# Patient Record
Sex: Male | Born: 1980 | Race: Black or African American | Hispanic: No | Smoking: Current some day smoker
Health system: Southern US, Community
[De-identification: ages and names within clinical notes are randomized; demographics above are authoritative.]

---

## 2002-12-27 ENCOUNTER — Emergency Department (HOSPITAL_COMMUNITY): Admission: EM | Admit: 2002-12-27 | Discharge: 2002-12-27 | Payer: Self-pay | Admitting: Emergency Medicine

## 2003-06-16 ENCOUNTER — Emergency Department (HOSPITAL_COMMUNITY): Admission: EM | Admit: 2003-06-16 | Discharge: 2003-06-16 | Payer: Self-pay | Admitting: *Deleted

## 2003-06-30 ENCOUNTER — Emergency Department (HOSPITAL_COMMUNITY): Admission: EM | Admit: 2003-06-30 | Discharge: 2003-06-30 | Payer: Self-pay | Admitting: Emergency Medicine

## 2005-10-27 ENCOUNTER — Emergency Department: Payer: Self-pay | Admitting: Emergency Medicine

## 2006-09-11 ENCOUNTER — Emergency Department (HOSPITAL_COMMUNITY): Admission: EM | Admit: 2006-09-11 | Discharge: 2006-09-11 | Payer: Self-pay | Admitting: Emergency Medicine

## 2013-05-18 ENCOUNTER — Encounter (HOSPITAL_COMMUNITY): Payer: Self-pay | Admitting: *Deleted

## 2013-05-18 ENCOUNTER — Emergency Department (HOSPITAL_COMMUNITY)
Admission: EM | Admit: 2013-05-18 | Discharge: 2013-05-18 | Disposition: A | Discharge status: Home / Self Care | Payer: Medicaid Other | Attending: Emergency Medicine | Admitting: Emergency Medicine

## 2013-05-18 DIAGNOSIS — H53149 Visual discomfort, unspecified: Secondary | ICD-10-CM | POA: Insufficient documentation

## 2013-05-18 DIAGNOSIS — Y929 Unspecified place or not applicable: Secondary | ICD-10-CM | POA: Insufficient documentation

## 2013-05-18 DIAGNOSIS — S0501XA Injury of conjunctiva and corneal abrasion without foreign body, right eye, initial encounter: Secondary | ICD-10-CM

## 2013-05-18 DIAGNOSIS — Y939 Activity, unspecified: Secondary | ICD-10-CM | POA: Insufficient documentation

## 2013-05-18 DIAGNOSIS — H5789 Other specified disorders of eye and adnexa: Secondary | ICD-10-CM | POA: Insufficient documentation

## 2013-05-18 DIAGNOSIS — Z23 Encounter for immunization: Secondary | ICD-10-CM | POA: Insufficient documentation

## 2013-05-18 DIAGNOSIS — S058X9A Other injuries of unspecified eye and orbit, initial encounter: Secondary | ICD-10-CM | POA: Insufficient documentation

## 2013-05-18 DIAGNOSIS — T1590XA Foreign body on external eye, part unspecified, unspecified eye, initial encounter: Secondary | ICD-10-CM | POA: Insufficient documentation

## 2013-05-18 DIAGNOSIS — F17200 Nicotine dependence, unspecified, uncomplicated: Secondary | ICD-10-CM | POA: Insufficient documentation

## 2013-05-18 MED ORDER — TETRACAINE HCL 0.5 % OP SOLN
1.0000 [drp] | Freq: Once | OPHTHALMIC | Status: AC
  Administered 2013-05-18: 1 [drp] via OPHTHALMIC
  Filled 2013-05-18: qty 2

## 2013-05-18 MED ORDER — TOBRAMYCIN 0.3 % OP SOLN
2.0000 [drp] | OPHTHALMIC | Status: DC
  Administered 2013-05-18: 2 [drp] via OPHTHALMIC
  Filled 2013-05-18: qty 5

## 2013-05-18 MED ORDER — FLUORESCEIN SODIUM 1 MG OP STRP
ORAL_STRIP | OPHTHALMIC | Status: AC
  Filled 2013-05-18: qty 1

## 2013-05-18 MED ORDER — TETANUS-DIPHTH-ACELL PERTUSSIS 5-2.5-18.5 LF-MCG/0.5 IM SUSP
0.5000 mL | Freq: Once | INTRAMUSCULAR | Status: AC
  Administered 2013-05-18: 0.5 mL via INTRAMUSCULAR
  Filled 2013-05-18: qty 0.5

## 2013-05-18 NOTE — ED Provider Notes (Signed)
  CSN: [id number]     Arrival date & time 05/18/13  1408 History     First MD Initiated Contact with Patient 05/18/13 1436     Chief Complaint  Patient presents with  . Eye Pain   (Consider location/radiation/quality/duration/timing/severity/associated sxs/prior Treatment) Patient is a 32 y.o. male presenting with eye pain. The history is provided by the patient.  Eye Pain This is a new problem. The current episode started in the past 7 days. The problem occurs constantly. The problem has been unchanged. Pertinent negatives include no headaches, nausea or vomiting.   Kristopher Sharp is a 32 y.o. male who presents to the ED with possible foreign body in right eye. States that 3 days ago he felt something fly in right eye. He tried to flush out the eye but continues to feel as if something is in the eye. Eye is red and irritated.   History reviewed. No pertinent past medical history. History reviewed. No pertinent past surgical history. History reviewed. No pertinent family history. History  Substance Use Topics  . Smoking status: Current Every Day Smoker    Types: Cigars  . Smokeless tobacco: Not on file  . Alcohol Use: No    Review of Systems  Eyes: Positive for photophobia, pain, redness and itching. Negative for visual disturbance.  Gastrointestinal: Negative for nausea and vomiting.  Skin: Negative for wound.  Neurological: Negative for headaches.  Psychiatric/Behavioral: The patient is not nervous/anxious.     Allergies  Review of patient's allergies indicates not on file.  Home Medications  No current outpatient prescriptions on file. BP 119/68  Pulse 70  Temp(Src) 98.6 F (37 C) (Oral)  Resp 20  Ht 5\' 7"  (1.702 m)  Wt 150 lb (68.04 kg)  BMI 23.49 kg/m2  SpO2 100% Physical Exam  Nursing note and vitals reviewed. Constitutional: He is oriented to person, place, and time. He appears well-developed and well-nourished. No distress.  HENT:  Head: Normocephalic.    Eyes: EOM are normal. Pupils are equal, round, and reactive to light. No foreign bodies found. Right conjunctiva is injected. Right eye exhibits normal extraocular motion and no nystagmus.  Fundoscopic exam:      The right eye shows red reflex.  Slit lamp exam:      The right eye shows corneal abrasion and fluorescein uptake.    Neck: Neck supple.  Cardiovascular: Normal rate.   Pulmonary/Chest: Effort normal.  Abdominal: Soft. There is no tenderness.  Musculoskeletal: Normal range of motion.  Neurological: He is alert and oriented to person, place, and time. No cranial nerve deficit.  Skin: Skin is warm and dry.  Psychiatric: He has a normal mood and affect. His behavior is normal.    ED Course: Dr. Manus Gunning in to examine patient    Procedures  MDM  32 y.o. male with corneal abrasions to right eye. Will update tetanus, start Tobramycin Opth drops and he is to follow up with opthalmology.  Discussed with the patient clinical findings and plan of care and all questioned fully answered.  [facility] Orlene Och, NP 05/18/13 1520

## 2013-05-18 NOTE — ED Notes (Signed)
Felt something in R eye 3 days ago.  Tried to flush it out, w/out effect.  Eye has con't. To feel as if something is in it and is red and irritated.

## 2013-05-19 ENCOUNTER — Encounter (HOSPITAL_COMMUNITY): Payer: Self-pay | Admitting: Emergency Medicine

## 2013-05-19 ENCOUNTER — Emergency Department (HOSPITAL_COMMUNITY)
Admission: EM | Admit: 2013-05-19 | Discharge: 2013-05-19 | Disposition: A | Discharge status: Home / Self Care | Payer: Medicaid Other | Attending: Emergency Medicine | Admitting: Emergency Medicine

## 2013-05-19 DIAGNOSIS — H5789 Other specified disorders of eye and adnexa: Secondary | ICD-10-CM

## 2013-05-19 DIAGNOSIS — F17200 Nicotine dependence, unspecified, uncomplicated: Secondary | ICD-10-CM | POA: Insufficient documentation

## 2013-05-19 DIAGNOSIS — H109 Unspecified conjunctivitis: Secondary | ICD-10-CM | POA: Insufficient documentation

## 2013-05-19 MED ORDER — CIPROFLOXACIN HCL 0.3 % OP SOLN
2.0000 [drp] | OPHTHALMIC | Status: DC
  Administered 2013-05-19: 2 [drp] via OPHTHALMIC
  Filled 2013-05-19: qty 2.5

## 2013-05-19 MED ORDER — CIPROFLOXACIN HCL 0.3 % OP SOLN
OPHTHALMIC | Status: AC
  Filled 2013-05-19: qty 2.5

## 2013-05-19 NOTE — ED Provider Notes (Signed)
CSN: [id number]     Arrival date & time 05/19/13  1226 History     First MD Initiated Contact with Patient 05/19/13 1348     Chief Complaint  Patient presents with  . Eye Pain   (Consider location/radiation/quality/duration/timing/severity/associated sxs/prior Treatment) HPI Comments: Pt states he was seen in the ED yesterday due to something scratching the right eye. He was treated with tobramycin and now his eye is more red. No vision changes reported. He noted exudate in the lashes this AM. He request re-evaluation and to be changed to another opthalmic antibiotic.  Patient is a 32 y.o. male presenting with eye pain. The history is provided by the patient.  Eye Pain Pertinent negatives include no abdominal pain, arthralgias, chest pain, coughing or neck pain.    No past medical history on file. History reviewed. No pertinent past surgical history. No family history on file. History  Substance Use Topics  . Smoking status: Current Every Day Smoker    Types: Cigars  . Smokeless tobacco: Not on file  . Alcohol Use: No    Review of Systems  Constitutional: Negative for activity change.       All ROS Neg except as noted in HPI  HENT: Negative for nosebleeds and neck pain.   Eyes: Positive for pain and redness. Negative for photophobia and discharge.  Respiratory: Negative for cough, shortness of breath and wheezing.   Cardiovascular: Negative for chest pain and palpitations.  Gastrointestinal: Negative for abdominal pain and blood in stool.  Genitourinary: Negative for dysuria, frequency and hematuria.  Musculoskeletal: Negative for back pain and arthralgias.  Skin: Negative.   Neurological: Negative for dizziness, seizures and speech difficulty.  Psychiatric/Behavioral: Negative for hallucinations and confusion.    Allergies  Review of patient's allergies indicates no known allergies.  Home Medications   Current Outpatient Rx  Name  Route  Sig  Dispense  Refill  .  tobramycin (TOBREX) 0.3 % ophthalmic solution   Right Eye   Place 2 drops into the right eye every 4 (four) hours.          BP 119/60  Pulse 60  Temp(Src) 98.9 F (37.2 C)  Resp 18  Ht 5\' 7"  (1.702 m)  Wt 150 lb (68.04 kg)  BMI 23.49 kg/m2  SpO2 99% Physical Exam  Nursing note and vitals reviewed. Constitutional: He is oriented to person, place, and time. He appears well-developed and well-nourished.  Non-toxic appearance.  HENT:  Head: Normocephalic.  Right Ear: Tympanic membrane and external ear normal.  Left Ear: Tympanic membrane and external ear normal.  Eyes: EOM and lids are normal. Pupils are equal, round, and reactive to light.  There is increased redness of the bulbar conjunctiva as well as the conjunctiva. The anterior chamber of the right is clear. There no lesions noted under the upper or lower lid. There is no foreign body noted of the upper or lower lid. There is no increased redness or red streaking in the right orbit area. The extraocular movement on the right and left are intact.  Neck: Normal range of motion. Neck supple. Carotid bruit is not present.  Cardiovascular: Normal rate, regular rhythm, normal heart sounds, intact distal pulses and normal pulses.   Pulmonary/Chest: Breath sounds normal. No respiratory distress.  Abdominal: Soft. Bowel sounds are normal. There is no tenderness. There is no guarding.  Musculoskeletal: Normal range of motion.  Lymphadenopathy:       Head (right side): No submandibular adenopathy present.  Head (left side): No submandibular adenopathy present.    He has no cervical adenopathy.  Neurological: He is alert and oriented to person, place, and time. He has normal strength. No cranial nerve deficit or sensory deficit.  Skin: Skin is warm and dry.  Psychiatric: He has a normal mood and affect. His speech is normal.    ED Course   Procedures (including critical care time)  Labs Reviewed - No data to display No results  found. No diagnosis found.  MDM   No lesion or fb under the right eye lid. No evidence for Sty. No periorbital redness. Pt advised to stop the tobramycin. Cipro ordered for the right eye, and cool compress. Pt to follow up with Dr Lita Mains if not improving.I have reviewed nursing notes, vital signs, and all appropriate lab and imaging results for this patient.  Kathie Dike, PA-C 05/21/13 2006

## 2013-05-19 NOTE — ED Provider Notes (Signed)
Medical screening examination/treatment/procedure(s) were performed by non-physician practitioner and as supervising physician I was immediately available for consultation/collaboration.   Shae Hinnenkamp M Aries Townley, MD 05/19/13 1920 

## 2013-05-19 NOTE — ED Provider Notes (Signed)
Medical screening examination/treatment/procedure(s) were conducted as a shared visit with non-physician practitioner(s) and myself.  I personally evaluated the patient during the encounter  R eye irritation x 3 days. Felt like something got in it. No vision change, no glasses or contacts. No Pain with EOMI.  Conjunctival injection, anterior chamber clear. Punctate abrasion to 7 oclock position.  Glynn Octave, MD 05/19/13 [phone]

## 2013-05-19 NOTE — ED Notes (Signed)
Pt c/o increased redness in right eye. Pt seen in ED yesterday and given Tobramycin eye drops.

## 2013-05-23 NOTE — ED Provider Notes (Signed)
Medical screening examination/treatment/procedure(s) were performed by non-physician practitioner and as supervising physician I was immediately available for consultation/collaboration.   Andranik Jeune M Kasiya Burck, MD 05/23/13 1638 

## 2013-10-30 ENCOUNTER — Encounter (HOSPITAL_COMMUNITY): Payer: Self-pay | Admitting: Emergency Medicine

## 2013-10-30 ENCOUNTER — Emergency Department (HOSPITAL_COMMUNITY)
Admission: EM | Admit: 2013-10-30 | Discharge: 2013-10-30 | Disposition: A | Discharge status: Home / Self Care | Payer: Medicaid Other | Attending: Emergency Medicine | Admitting: Emergency Medicine

## 2013-10-30 DIAGNOSIS — Z79899 Other long term (current) drug therapy: Secondary | ICD-10-CM | POA: Insufficient documentation

## 2013-10-30 DIAGNOSIS — B353 Tinea pedis: Secondary | ICD-10-CM | POA: Insufficient documentation

## 2013-10-30 DIAGNOSIS — Z87891 Personal history of nicotine dependence: Secondary | ICD-10-CM | POA: Insufficient documentation

## 2013-10-30 MED ORDER — CLOTRIMAZOLE-BETAMETHASONE 1-0.05 % EX CREA: TOPICAL_CREAM | CUTANEOUS | Status: DC

## 2013-10-30 NOTE — ED Notes (Signed)
?   Spider bite to left foot "for months."  [facility] and given abx with no relief.

## 2013-11-01 NOTE — ED Provider Notes (Signed)
CSN: [id number]     Arrival date & time 10/30/13  1633 History   First MD Initiated Contact with Patient 10/30/13 1724     Chief Complaint  Patient presents with  . Insect Bite   (Consider location/radiation/quality/duration/timing/severity/associated sxs/prior Treatment) HPI Comments: Kristopher Sharp is a 33 y.o. male who presents to the Emergency Department complaining of persistent skin lesion to the left foot.  States he was seen at another facility and given antibiotics and bactroban cream and told it was a "spider bite".  Patient states the lesion has been present for "months" and did not improve after the medication.  He c/o of itching and crusting to the area.  He denies red streaks, swelling, fever or chills  The history is provided by the patient.    History reviewed. No pertinent past medical history. History reviewed. No pertinent past surgical history. No family history on file. History  Substance Use Topics  . Smoking status: Former Smoker    Types: Cigars  . Smokeless tobacco: Not on file  . Alcohol Use: No     Comment: occ    Review of Systems  Constitutional: Negative for fever and chills.  Gastrointestinal: Negative for nausea and vomiting.  Musculoskeletal: Negative for arthralgias, joint swelling and myalgias.  Skin: Positive for color change.       Skin lesion left foot  Neurological: Negative for weakness and numbness.  Hematological: Negative for adenopathy.  All other systems reviewed and are negative.    Allergies  Review of patient's allergies indicates no known allergies.  Home Medications   Current Outpatient Rx  Name  Route  Sig  Dispense  Refill  . mupirocin ointment (BACTROBAN) 2 %   Nasal   Place 1 application into the nose 2 (two) times daily.         . clotrimazole-betamethasone (LOTRISONE) cream      Apply to affected area 2 times daily prn   15 g   0   . sulfamethoxazole-trimethoprim (BACTRIM DS,SEPTRA DS) 800-160 MG per  tablet   Oral   Take 1 tablet by mouth 2 (two) times daily. 10 day course starting on 10/13/2013          BP 109/62  Pulse 60  Temp(Src) 98.4 F (36.9 C) (Oral)  Resp 16  Ht 5\' 7"  (1.702 m)  Wt 150 lb (68.04 kg)  BMI 23.49 kg/m2  SpO2 96% Physical Exam  Nursing note and vitals reviewed. Constitutional: He is oriented to person, place, and time. He appears well-developed and well-nourished. No distress.  HENT:  Head: Normocephalic and atraumatic.  Cardiovascular: Normal rate, regular rhythm and normal heart sounds.   No murmur heard. Pulmonary/Chest: Effort normal and breath sounds normal. No respiratory distress.  Musculoskeletal: He exhibits no edema and no tenderness.  Left DP pulse brisk, distal sensation intact, pt has full ROM of the left foot.  Neurological: He is alert and oriented to person, place, and time. He exhibits normal muscle tone. Coordination normal.  Skin: Skin is warm and dry.  Dime sized scaly lesion to the dorsal left foot , maculopapular with well defined borders.  No induration, fluctuance or vesicles.  No edema of the foot.      ED Course  Procedures (including critical care time) Labs Review Labs Reviewed - No data to display Imaging Review No results found.  EKG Interpretation   None       MDM   1. Tinea pedis     Lesion  appears fungal.  Pt agrees to antifungal cream and referral given for dermatology.  VSS.  Pt appears stable for d/c   Gaberial Cada L. Trisha Mangleriplett, PA-C 11/01/13 1211

## 2013-11-07 NOTE — ED Provider Notes (Signed)
Medical screening examination/treatment/procedure(s) were performed by non-physician practitioner and as supervising physician I was immediately available for consultation/collaboration.  EKG Interpretation   None       Irmalee Riemenschneider, MD, FACEP   Zymeir Salminen L Kuper Rennels, MD 11/07/13 1505 

## 2014-01-27 ENCOUNTER — Emergency Department (HOSPITAL_COMMUNITY)
Admission: EM | Admit: 2014-01-27 | Discharge: 2014-01-27 | Disposition: A | Discharge status: Home / Self Care | Payer: Medicaid Other | Attending: Emergency Medicine | Admitting: Emergency Medicine

## 2014-01-27 ENCOUNTER — Emergency Department (HOSPITAL_COMMUNITY): Payer: Medicaid Other

## 2014-01-27 ENCOUNTER — Encounter (HOSPITAL_COMMUNITY): Payer: Self-pay | Admitting: Emergency Medicine

## 2014-01-27 DIAGNOSIS — Y929 Unspecified place or not applicable: Secondary | ICD-10-CM | POA: Insufficient documentation

## 2014-01-27 DIAGNOSIS — S8261XA Displaced fracture of lateral malleolus of right fibula, initial encounter for closed fracture: Secondary | ICD-10-CM

## 2014-01-27 DIAGNOSIS — W172XXA Fall into hole, initial encounter: Secondary | ICD-10-CM | POA: Insufficient documentation

## 2014-01-27 DIAGNOSIS — X500XXA Overexertion from strenuous movement or load, initial encounter: Secondary | ICD-10-CM | POA: Insufficient documentation

## 2014-01-27 DIAGNOSIS — Y9389 Activity, other specified: Secondary | ICD-10-CM | POA: Insufficient documentation

## 2014-01-27 DIAGNOSIS — S8263XA Displaced fracture of lateral malleolus of unspecified fibula, initial encounter for closed fracture: Secondary | ICD-10-CM | POA: Insufficient documentation

## 2014-01-27 DIAGNOSIS — Z87891 Personal history of nicotine dependence: Secondary | ICD-10-CM | POA: Insufficient documentation

## 2014-01-27 MED ORDER — OXYCODONE-ACETAMINOPHEN 5-325 MG PO TABS: 1.0000 | ORAL_TABLET | ORAL | Status: DC | PRN

## 2014-01-27 NOTE — Discharge Instructions (Signed)
Wear the cam walker until you see the orthopedic doctor. Use crutches as needed.   Ankle Fracture A fracture is a break in the bone. A cast or splint is used to protect and keep your injured bone from moving.  HOME CARE INSTRUCTIONS   Use your crutches as directed.  To lessen the swelling, keep the injured leg elevated while sitting or lying down.  Apply ice to the injury for 15-20 minutes, 03-04 times per day while awake for 2 days. Put the ice in a plastic bag and place a thin towel between the bag of ice and your cast.  If you have a plaster or fiberglass cast:  Do not try to scratch the skin under the cast using sharp or pointed objects.  Check the skin around the cast every day. You may put lotion on any red or sore areas.  Keep your cast dry and clean.  If you have a plaster splint:  Wear the splint as directed.  You may loosen the elastic around the splint if your toes become numb, tingle, or turn cold or blue.  Do not put pressure on any part of your cast or splint; it may break. Rest your cast only on a pillow the first 24 hours until it is fully hardened.  Your cast or splint can be protected during bathing with a plastic bag. Do not lower the cast or splint into water.  Take medications as directed by your caregiver. Only take over-the-counter or prescription medicines for pain, discomfort, or fever as directed by your caregiver.  Do not drive a vehicle until your caregiver specifically tells you it is safe to do so.  If your caregiver has given you a follow-up appointment, it is very important to keep that appointment. Not keeping the appointment could result in a chronic or permanent injury, pain, and disability. If there is any problem keeping the appointment, you must call back to this facility for assistance. SEEK IMMEDIATE MEDICAL CARE IF:   Your cast gets damaged or breaks.  You have continued severe pain or more swelling than you did before the cast was put  on.  Your skin or toenails below the injury turn blue or gray, or feel cold or numb.  There is a bad smell or new stains and/or purulent (pus like) drainage coming from under the cast. If you do not have a window in your cast for observing the wound, a discharge or minor bleeding may show up as a stain on the outside of your cast. Report these findings to your caregiver. MAKE SURE YOU:   Understand these instructions.  Will watch your condition.  Will get help right away if you are not doing well or get worse. Document Released: 10/01/2000 Document Revised: 12/27/2011 Document Reviewed: 05/03/2013 [facility] Patient Information 2014 [place], [place].  Crutch Use Crutches are used to take weight off one of your legs or feet when you stand or walk. It is important to use crutches that fit properly. When fitted properly:  Each crutch should be 2 3 finger widths below the armpit.  Your weight should be supported by your hand, and not by resting the armpit on the crutch.  RISKS AND COMPLICATIONS Damage to the nerves that extend from your armpit to your hand and arm. To prevent this from happening, make sure your crutches fit properly and do not put pressure on your armpit when using them. HOW TO USE YOUR CRUTCHES If you have been instructed to use partial  weight bearing, apply (bear) the amount of weight as your health care provider suggests. Do not bear weight in an amount that causes pain to the area of injury. Walking 1. Step with the crutches. 2. Swing the healthy leg slightly ahead of the crutches. Going Up Steps If there is no handrail: 1. Step up with the healthy leg. 2. Step up with the crutches and injured leg. 3. Continue in this way. If there is a handrail: 1. Hold both crutches in one hand. 2. Place your free hand on the handrail. 3. While putting your weight on your arms, lift your healthy leg to the step. 4. Bring the crutches and the injured leg up to that  step. 5. Continue in this way. Going Down Steps Be very careful, as going down stairs with crutches is very challenging. If there is no handrail: 1. Step down with the injured leg and crutches. 2. Step down with the healthy leg. If there is a handrail: 1. Place your hand on the handrail. 2. Hold both crutches with your free hand. 3. Lower your injured leg and crutch to the step below you. Make sure to keep the crutch tips in the center of the step, never on the edge. 4. Lower your healthy leg to that step. 5. Continue in this way. Standing Up 1. Hold the injured leg forward. 2. Grab the armrest with one hand and the top of the crutches with the other hand. 3. Using these supports, pull yourself up to a standing position. Sitting Down 1. Hold the injured leg forward. 2. Grab the armrest with one hand and the top of the crutches with the other hand. 3. Lower yourself to a sitting position. SEEK MEDICAL CARE IF:  You still feel unsteady on your feet.  You develop new pain, for example in your armpits, back, shoulder, wrist, or hip.  You develop any numbness or tingling. SEEK IMMEDIATE MEDICAL CARE: You fall. Document Released: 10/01/2000 Document Revised: 07/25/2013 Document Reviewed: 06/11/2013 [facility] Patient Information 2014 [place], [place].  Acetaminophen; Oxycodone tablets What is this medicine? ACETAMINOPHEN; OXYCODONE (a set a MEE noe fen; ox i KOE done) is a pain reliever. It is used to treat mild to moderate pain. This medicine may be used for other purposes; ask your health care provider or pharmacist if you have questions. COMMON BRAND NAME(S): Endocet, Magnacet, Narvox, Percocet, Perloxx, Primalev, Primlev, Roxicet, Xolox What should I tell my health care provider before I take this medicine? They need to know if you have any of these conditions: -brain tumor -Crohn's disease, inflammatory bowel disease, or ulcerative colitis -drug abuse or addiction -head  injury -heart or circulation problems -if you often drink alcohol -kidney disease or problems going to the bathroom -liver disease -lung disease, asthma, or breathing problems -an unusual or allergic reaction to acetaminophen, oxycodone, other opioid analgesics, other medicines, foods, dyes, or preservatives -pregnant or trying to get pregnant -breast-feeding How should I use this medicine? Take this medicine by mouth with a full glass of water. Follow the directions on the prescription label. Take your medicine at regular intervals. Do not take your medicine more often than directed. Talk to your pediatrician regarding the use of this medicine in children. Special care may be needed. Patients over 54 years old may have a stronger reaction and need a smaller dose. Overdosage: If you think you have taken too much of this medicine contact a poison control center or emergency room at once. NOTE: This medicine is only  for you. Do not share this medicine with others. What if I miss a dose? If you miss a dose, take it as soon as you can. If it is almost time for your next dose, take only that dose. Do not take double or extra doses. What may interact with this medicine? -alcohol -antihistamines -barbiturates like amobarbital, butalbital, butabarbital, methohexital, pentobarbital, phenobarbital, thiopental, and secobarbital -benztropine -drugs for bladder problems like solifenacin, trospium, oxybutynin, tolterodine, hyoscyamine, and methscopolamine -drugs for breathing problems like ipratropium and tiotropium -drugs for certain stomach or intestine problems like propantheline, homatropine methylbromide, glycopyrrolate, atropine, belladonna, and dicyclomine -general anesthetics like etomidate, ketamine, nitrous oxide, propofol, desflurane, enflurane, halothane, isoflurane, and sevoflurane -medicines for depression, anxiety, or psychotic disturbances -medicines for sleep -muscle  relaxants -naltrexone -narcotic medicines (opiates) for pain -phenothiazines like perphenazine, thioridazine, chlorpromazine, mesoridazine, fluphenazine, prochlorperazine, promazine, and trifluoperazine -scopolamine -tramadol -trihexyphenidyl This list may not describe all possible interactions. Give your health care provider a list of all the medicines, herbs, non-prescription drugs, or dietary supplements you use. Also tell them if you smoke, drink alcohol, or use illegal drugs. Some items may interact with your medicine. What should I watch for while using this medicine? Tell your doctor or health care professional if your pain does not go away, if it gets worse, or if you have new or a different type of pain. You may develop tolerance to the medicine. Tolerance means that you will need a higher dose of the medication for pain relief. Tolerance is normal and is expected if you take this medicine for a long time. Do not suddenly stop taking your medicine because you may develop a severe reaction. Your body becomes used to the medicine. This does NOT mean you are addicted. Addiction is a behavior related to getting and using a drug for a non-medical reason. If you have pain, you have a medical reason to take pain medicine. Your doctor will tell you how much medicine to take. If your doctor wants you to stop the medicine, the dose will be slowly lowered over time to avoid any side effects. You may get drowsy or dizzy. Do not drive, use machinery, or do anything that needs mental alertness until you know how this medicine affects you. Do not stand or sit up quickly, especially if you are an older patient. This reduces the risk of dizzy or fainting spells. Alcohol may interfere with the effect of this medicine. Avoid alcoholic drinks. There are different types of narcotic medicines (opiates) for pain. If you take more than one type at the same time, you may have more side effects. Give your health care  provider a list of all medicines you use. Your doctor will tell you how much medicine to take. Do not take more medicine than directed. Call emergency for help if you have problems breathing. The medicine will cause constipation. Try to have a bowel movement at least every 2 to 3 days. If you do not have a bowel movement for 3 days, call your doctor or health care professional. Do not take Tylenol (acetaminophen) or medicines that have acetaminophen with this medicine. Too much acetaminophen can be very dangerous. Many nonprescription medicines contain acetaminophen. Always read the labels carefully to avoid taking more acetaminophen. What side effects may I notice from receiving this medicine? Side effects that you should report to your doctor or health care professional as soon as possible: -allergic reactions like skin rash, itching or hives, swelling of the face, lips, or tongue -breathing  difficulties, wheezing -confusion -light headedness or fainting spells -severe stomach pain -unusually weak or tired -yellowing of the skin or the whites of the eyes  Side effects that usually do not require medical attention (report to your doctor or health care professional if they continue or are bothersome): -dizziness -drowsiness -nausea -vomiting This list may not describe all possible side effects. Call your doctor for medical advice about side effects. You may report side effects to FDA at 1-800-FDA-1088. Where should I keep my medicine? Keep out of the reach of children. This medicine can be abused. Keep your medicine in a safe place to protect it from theft. Do not share this medicine with anyone. Selling or giving away this medicine is dangerous and against the law. Store at room temperature between 20 and 25 degrees C (68 and 77 degrees F). Keep container tightly closed. Protect from light. This medicine may cause accidental overdose and death if it is taken by other adults, children, or pets.  Flush any unused medicine down the toilet to reduce the chance of harm. Do not use the medicine after the expiration date. NOTE: This sheet is a summary. It may not cover all possible information. If you have questions about this medicine, talk to your doctor, pharmacist, or health care provider.  2014, [employer]. (2013-05-28 13:17:35)

## 2014-01-27 NOTE — ED Notes (Signed)
Pt reports twisting right ankle about 3 hours ago.  Mild swelling noted to ankle.

## 2014-01-27 NOTE — ED Provider Notes (Signed)
CSN: [id number]     Arrival date & time 01/27/14  7253 History   First MD Initiated Contact with Patient 01/27/14 0539     Chief Complaint  Patient presents with  . Ankle Pain     (Consider location/radiation/quality/duration/timing/severity/associated sxs/prior Treatment) Patient is a 33 y.o. male presenting with ankle pain. The history is provided by the patient.  Ankle Pain He twisted his right ankle with an inversion injury. He stepped in a hole and then twisted his ankle as he fell. He is complaining of pain over the anterior aspect of his ankle. He is able to ambulate but with significant pain. Pain is worse with weightbearing and better with keeping weight off of his foot. He he is pain-free with is at rest but complains of pain of 8/10 with weightbearing. He denies any other injury  History reviewed. No pertinent past medical history. History reviewed. No pertinent past surgical history. History reviewed. No pertinent family history. History  Substance Use Topics  . Smoking status: Former Smoker    Types: Cigars  . Smokeless tobacco: Not on file  . Alcohol Use: No     Comment: occ    Review of Systems  All other systems reviewed and are negative.     Allergies  Review of patient's allergies indicates no known allergies.  Home Medications   Current Outpatient Rx  Name  Route  Sig  Dispense  Refill  . clotrimazole-betamethasone (LOTRISONE) cream      Apply to affected area 2 times daily prn   15 g   0   . mupirocin ointment (BACTROBAN) 2 %   Nasal   Place 1 application into the nose 2 (two) times daily.         Marland Kitchen sulfamethoxazole-trimethoprim (BACTRIM DS,SEPTRA DS) 800-160 MG per tablet   Oral   Take 1 tablet by mouth 2 (two) times daily. 10 day course starting on 10/13/2013          BP 122/78  Pulse 73  Temp(Src) 98.6 F (37 C) (Oral)  Resp 18  Ht 5\' 7"  (1.702 m)  Wt 150 lb (68.04 kg)  BMI 23.49 kg/m2  SpO2 98% Physical Exam  Nursing note and  vitals reviewed.  33 year old male, resting comfortably and in no acute distress. Vital signs are normal. Oxygen saturation is 98%, which is normal. Head is normocephalic and atraumatic. PERRLA, EOMI. Oropharynx is clear. Neck is nontender and supple without adenopathy or JVD. Back is nontender and there is no CVA tenderness. Lungs are clear without rales, wheezes, or rhonchi. Chest is nontender. Heart has regular rate and rhythm without murmur. Abdomen is soft, flat, nontender without masses or hepatosplenomegaly and peristalsis is normoactive. Extremities: There is no swelling or deformity of the right ankle but there is marked tenderness to palpation over the distal fibula anteriorly over the fibulotalar ligament. There is no instability of the ankle joint. Anterior drawer sign is negative. Distal neurovascular exam is intact with strong pulses, prompt capillary refill, normal sensation. No other extremity injuries seen. Skin is warm and dry without rash. Neurologic: Mental status is normal, cranial nerves are intact, there are no motor or sensory deficits.  ED Course  Procedures (including critical care time) Imaging Review Dg Ankle Complete Right  01/27/2014   CLINICAL DATA:  Twisting injury.  EXAM: RIGHT ANKLE - COMPLETE 3+ VIEW  COMPARISON:  None available for comparison at time of study interpretation.  FINDINGS: Tiny bony fragment inferior to the lateral malleolus  suggests avulsion injury best seen on AP view. No dislocation. The ankle mortise appears congruent and the tibiofibular syndesmosis intact. No destructive bony lesions. Soft tissue swelling without subcutaneous gas or radiopaque foreign bodies.  IMPRESSION: Suspected nondisplaced lateral malleolus tiny avulsion fracture without dislocation.   Electronically Signed   By: Awilda Metroourtnay  Bloomer   On: 01/27/2014 05:41   Images viewed by me.  MDM   Final diagnoses:  Avulsion fracture of lateral malleolus of right fibula    Avulsion fracture of the right ankle. He is placed in a Cam Walker and given crutches and prescription given for oxycodone acetaminophen. He is referred to orthopedics for followup.    Dione Boozeavid Laiya Wisby, MD 01/27/14 [phone]

## 2014-10-30 ENCOUNTER — Emergency Department (HOSPITAL_COMMUNITY)
Admission: EM | Admit: 2014-10-30 | Discharge: 2014-10-30 | Disposition: A | Discharge status: Home / Self Care | Payer: Medicaid Other | Attending: Emergency Medicine | Admitting: Emergency Medicine

## 2014-10-30 ENCOUNTER — Encounter (HOSPITAL_COMMUNITY): Payer: Self-pay | Admitting: *Deleted

## 2014-10-30 DIAGNOSIS — J029 Acute pharyngitis, unspecified: Secondary | ICD-10-CM | POA: Insufficient documentation

## 2014-10-30 DIAGNOSIS — H6691 Otitis media, unspecified, right ear: Secondary | ICD-10-CM

## 2014-10-30 DIAGNOSIS — Z87891 Personal history of nicotine dependence: Secondary | ICD-10-CM | POA: Insufficient documentation

## 2014-10-30 DIAGNOSIS — R61 Generalized hyperhidrosis: Secondary | ICD-10-CM | POA: Insufficient documentation

## 2014-10-30 DIAGNOSIS — H6693 Otitis media, unspecified, bilateral: Secondary | ICD-10-CM | POA: Insufficient documentation

## 2014-10-30 MED ORDER — AMOXICILLIN 500 MG PO CAPS: 500.0000 mg | ORAL_CAPSULE | Freq: Three times a day (TID) | ORAL | Status: DC

## 2014-10-30 MED ORDER — DEXAMETHASONE 10 MG/ML FOR PEDIATRIC ORAL USE
10.0000 mg | Freq: Once | INTRAMUSCULAR | Status: AC
  Administered 2014-10-30: 10 mg via ORAL
  Filled 2014-10-30: qty 1

## 2014-10-30 NOTE — ED Provider Notes (Signed)
CSN: [id number]     Arrival date & time 10/30/14  [id number] History  This chart was scribed for Kristopher Gaskinsonald W Birdia Jaycox, MD by Tonye RoyaltyJoshua Chen, ED Scribe. This patient was seen in room APA08/APA08 and the patient's care was started at 8:08 AM.    Chief Complaint  Patient presents with  . Sore Throat   Patient is a 34 y.o. male presenting with pharyngitis. The history is provided by the patient. No language interpreter was used.  Sore Throat This is a new problem. The current episode started 2 days ago. The problem occurs constantly. The problem has been gradually worsening. Pertinent negatives include no chest pain and no shortness of breath. Nothing aggravates the symptoms. The symptoms are relieved by medications and NSAIDs. Treatments tried: Dayquil, Iburofen. The treatment provided mild relief.    HPI Comments: Kristopher Sharp is a 34 y.o. male who presents to the Emergency Department complaining of sore throat with onset 2 days ago, worse this morning. He notes symptoms are worse in the morning. He reports associated hot and cold spells with diaphoresis especially at night, cough with onset yesterday, bilateral ear pain worse on the right, and nasal congestion. He notes he has been eating less because eating irritates his throat. He states he was sent home from work today and referred here. He states he has used Dayquil and Ibuprofen with improvement. He states he does not take any medications on a daily basis. He denies fever, vomiting, or diarrhea.  PMH - none  History  Substance Use Topics  . Smoking status: Former Smoker    Types: Cigars  . Smokeless tobacco: Not on file  . Alcohol Use: No     Comment: occ    Review of Systems  Constitutional: Positive for chills and diaphoresis. Negative for fever.  HENT: Positive for congestion, ear pain and sore throat.   Respiratory: Positive for cough. Negative for shortness of breath.   Cardiovascular: Negative for chest pain.  Gastrointestinal:  Negative for vomiting and diarrhea.    Allergies  Review of patient's allergies indicates no known allergies.  Home Medications   Prior to Admission medications   Medication Sig Start Date End Date Taking? Authorizing Provider  clotrimazole-betamethasone (LOTRISONE) cream Apply to affected area 2 times daily prn Patient not taking: Reported on 10/30/2014 10/30/13   Tammy L. Triplett, PA-C  oxyCODONE-acetaminophen (PERCOCET/ROXICET) 5-325 MG per tablet Take 1 tablet by mouth every 4 (four) hours as needed for severe pain. Patient not taking: Reported on 10/30/2014 01/27/14   Dione Boozeavid Glick, MD   BP 122/75 mmHg  Pulse 72  Temp(Src) 98.8 F (37.1 C) (Oral)  Resp 16  Ht 5\' 7"  (1.702 m)  Wt 165 lb (74.844 kg)  BMI 25.84 kg/m2  SpO2 100% Physical Exam  Nursing note and vitals reviewed.    CONSTITUTIONAL: Well developed/well nourished HEAD: Normocephalic/atraumatic EYES: EOMI/PERRL ENMT: Mucous membranes moist, right TM bulging erythematous but is intact, uvula midline with tonsillar hypertrophy erythema and exudates, no drooling or stridor noted NECK: supple no meningeal signs CV: S1/S2 noted, no murmurs/rubs/gallops noted LUNGS: Lungs are clear to auscultation bilaterally, no apparent distress ABDOMEN: soft, nontender, no rebound or guarding, bowel sounds noted throughout abdomen GU:no cva tenderness NEURO: Pt is awake/alert/appropriate, moves all extremitiesx4.  No facial droop.   EXTREMITIES: pulses normal/equal, full ROM SKIN: warm, color normal PSYCH: no abnormalities of mood noted, alert and oriented to situation  ED Course  Procedures   DIAGNOSTIC STUDIES: Oxygen Saturation is 100% on  room air, normal by my interpretation.    COORDINATION OF CARE: 8:15 AM Discussed treatment plan with patient at beside, including antibiotic for right ear infection and possible strep throat and steroid. Will write work note and instructed him that he can return tomorrow if he is feeling  better and is afebrile. Instructed him to return in case of neck stiffness, drooling, or difficulty swallowing. The patient agrees with the plan and has no further questions at this time.  Medications  dexamethasone (DECADRON) 10 MG/ML injection for Pediatric ORAL use 10 mg (10 mg Oral Given 10/30/14 0826)     MDM   Final diagnoses:  Acute right otitis media, recurrence not specified, unspecified otitis media type  Exudative pharyngitis    Nursing notes including past medical history and social history reviewed and considered in documentation   I personally performed the services described in this documentation, which was scribed in my presence. The recorded information has been reviewed and is accurate.      Kristopher Gaskins, MD 10/30/14 [phone]

## 2014-10-30 NOTE — ED Notes (Signed)
Pt states sore throat , nasal congestion and bilateral ear pain (right worse than left). Symptoms are worse in the mornings. Symptoms began 3 days ago

## 2015-02-16 ENCOUNTER — Encounter (HOSPITAL_COMMUNITY): Payer: Self-pay | Admitting: Emergency Medicine

## 2015-02-16 ENCOUNTER — Emergency Department (HOSPITAL_COMMUNITY): Payer: Medicaid Other

## 2015-02-16 ENCOUNTER — Emergency Department (HOSPITAL_COMMUNITY)
Admission: EM | Admit: 2015-02-16 | Discharge: 2015-02-16 | Disposition: A | Discharge status: Home / Self Care | Payer: Self-pay | Attending: Emergency Medicine | Admitting: Emergency Medicine

## 2015-02-16 DIAGNOSIS — R0789 Other chest pain: Secondary | ICD-10-CM | POA: Insufficient documentation

## 2015-02-16 DIAGNOSIS — Z792 Long term (current) use of antibiotics: Secondary | ICD-10-CM | POA: Insufficient documentation

## 2015-02-16 DIAGNOSIS — Z87891 Personal history of nicotine dependence: Secondary | ICD-10-CM | POA: Insufficient documentation

## 2015-02-16 LAB — COMPREHENSIVE METABOLIC PANEL
ALK PHOS: 74 U/L (ref 38–126)
ALT: 21 U/L (ref 17–63)
ANION GAP: 7 (ref 5–15)
AST: 23 U/L (ref 15–41)
Albumin: 4.1 g/dL (ref 3.5–5.0)
BILIRUBIN TOTAL: 0.6 mg/dL (ref 0.3–1.2)
BUN: 15 mg/dL (ref 6–20)
CO2: 27 mmol/L (ref 22–32)
Calcium: 9 mg/dL (ref 8.9–10.3)
Chloride: 104 mmol/L (ref 101–111)
Creatinine, Ser: 1.04 mg/dL (ref 0.61–1.24)
GFR calc non Af Amer: >60 mL/min (ref >60)
GLUCOSE: 116 mg/dL — AB (ref 70–99)
POTASSIUM: 3.7 mmol/L (ref 3.5–5.1)
Sodium: 138 mmol/L (ref 135–145)
TOTAL PROTEIN: 7.7 g/dL (ref 6.5–8.1)

## 2015-02-16 LAB — CBC WITH DIFFERENTIAL/PLATELET
BASOS PCT: 1 % (ref 0–1)
Basophils Absolute: 0.1 10*3/uL (ref 0.0–0.1)
Eosinophils Absolute: 0.2 10*3/uL (ref 0.0–0.7)
Eosinophils Relative: 4 % (ref 0–5)
HEMATOCRIT: 45.9 % (ref 39.0–52.0)
HEMOGLOBIN: 14.9 g/dL (ref 13.0–17.0)
LYMPHS ABS: 2.5 10*3/uL (ref 0.7–4.0)
LYMPHS PCT: 41 % (ref 12–46)
MCH: 26.8 pg (ref 26.0–34.0)
MCHC: 32.5 g/dL (ref 30.0–36.0)
MCV: 82.7 fL (ref 78.0–100.0)
MONO ABS: 0.4 10*3/uL (ref 0.1–1.0)
MONOS PCT: 6 % (ref 3–12)
NEUTROS ABS: 3 10*3/uL (ref 1.7–7.7)
NEUTROS PCT: 48 % (ref 43–77)
Platelets: 270 10*3/uL (ref 150–400)
RBC: 5.55 MIL/uL (ref 4.22–5.81)
RDW: 13.1 % (ref 11.5–15.5)
WBC: 6.2 10*3/uL (ref 4.0–10.5)

## 2015-02-16 LAB — TROPONIN I

## 2015-02-16 NOTE — ED Provider Notes (Signed)
CSN: [id number]     Arrival date & time 02/16/15  1627 History   First MD Initiated Contact with Patient 02/16/15 1638     Chief Complaint  Patient presents with  . Chest Pain     (Consider location/radiation/quality/duration/timing/severity/associated sxs/prior Treatment) HPI Comments: Patient is a 34 year old male with no prior cardiac history. He presents for evaluation of intermittent left lower anterior chest pains that have been occurring for the past 2 weeks. This may happen up to several times per day and only lasts for seconds, then resolves. He denies any shortness of breath, diaphoresis, nausea, or radiation to the arm or jaw. He has no cardiac risk factors.  Patient is a 34 y.o. male presenting with chest pain. The history is provided by the patient.  Chest Pain Pain location:  L chest and substernal area Pain quality: sharp   Pain radiates to:  Does not radiate Pain radiates to the back: no   Pain severity:  Moderate Onset quality:  Sudden Duration:  2 weeks Timing:  Intermittent Progression:  Worsening Chronicity:  New Context: not breathing and no movement   Relieved by:  Nothing Worsened by:  Nothing tried Ineffective treatments:  None tried Associated symptoms: no anxiety, no fatigue, no fever, no palpitations and no shortness of breath     History reviewed. No pertinent past medical history. History reviewed. No pertinent past surgical history. Family History  Problem Relation Age of Onset  . Cancer Father   . Seizures Other    History  Substance Use Topics  . Smoking status: Former Smoker    Types: Cigars    Quit date: 10/18/2013  . Smokeless tobacco: Never Used  . Alcohol Use: Yes     Comment: occasionally    Review of Systems  Constitutional: Negative for fever and fatigue.  Respiratory: Negative for shortness of breath.   Cardiovascular: Positive for chest pain. Negative for palpitations.  All other systems reviewed and are  negative.     Allergies  Review of patient's allergies indicates no known allergies.  Home Medications   Prior to Admission medications   Medication Sig Start Date End Date Taking? Authorizing Provider  amoxicillin (AMOXIL) 500 MG capsule Take 1 capsule (500 mg total) by mouth 3 (three) times daily. 10/30/14   Zadie Rhine, MD   BP 112/70 mmHg  Pulse 58  Temp(Src) 98.9 F (37.2 C) (Oral)  Resp 18  Ht 5' 6.5" (1.689 m)  Wt 165 lb (74.844 kg)  BMI 26.24 kg/m2  SpO2 100% Physical Exam  Constitutional: He is oriented to person, place, and time. He appears well-developed and well-nourished. No distress.  HENT:  Head: Normocephalic and atraumatic.  Neck: Normal range of motion. Neck supple.  Cardiovascular: Normal rate, regular rhythm and normal heart sounds.   No murmur heard. Pulmonary/Chest: Effort normal and breath sounds normal. No respiratory distress. He has no wheezes. He has no rales. He exhibits no tenderness.  Abdominal: Soft. Bowel sounds are normal. He exhibits no distension. There is no tenderness.  Musculoskeletal: Normal range of motion. He exhibits no edema.  Lymphadenopathy:    He has no cervical adenopathy.  Neurological: He is alert and oriented to person, place, and time.  Skin: Skin is warm and dry. He is not diaphoretic.  Nursing note and vitals reviewed.   ED Course  Procedures (including critical care time) Labs Review Labs Reviewed  CBC WITH DIFFERENTIAL/PLATELET  COMPREHENSIVE METABOLIC PANEL  TROPONIN I    Imaging Review No results  found.   EKG Interpretation   Date/Time:  Sunday Feb 16 2015 16:33:59 EDT Ventricular Rate:  56 PR Interval:  150 QRS Duration: 88 QT Interval:  372 QTC Calculation: 359 R Axis:   11 Text Interpretation:  Sinus rhythm Low voltage, precordial leads Confirmed  by DELOS  MD, Yan Pankratz ([id number]) on 02/16/2015 4:52:29 PM      MDM   Final diagnoses:  None    Patient presents with intermittent sharp pains  in his left lower anterior chest wall that have been occurring for the past 2 weeks. They're not related with exertion and he has no cardiac risk factors. He has no prior cardiac history. Workup reveals a normal EKG, negative troponin, clear chest x-ray and the remainder of his laboratory studies are unremarkable. At this point I believe he is appropriate for discharge. He will be treated as if this pain is musculoskeletal with anti-inflammatories and when necessary return.    Geoffery Lyonsouglas Traeson Dusza, MD 02/16/15 [phone]

## 2015-02-16 NOTE — ED Notes (Signed)
MD at bedside. 

## 2015-02-16 NOTE — Discharge Instructions (Signed)
Ibuprofen 600 mg 3 times daily for the next 5 days. ° °Follow-up with your primary Dr. if not improving in the next week, and return to the ER if your symptoms significantly worsen or change. ° ° °Chest Wall Pain °Chest wall pain is pain in or around the bones and muscles of your chest. It may take up to 6 weeks to get better. It may take longer if you must stay physically active in your work and activities.  °CAUSES  °Chest wall pain may happen on its own. However, it may be caused by: °· A viral illness like the flu. °· Injury. °· Coughing. °· Exercise. °· Arthritis. °· Fibromyalgia. °· Shingles. °HOME CARE INSTRUCTIONS  °· Avoid overtiring physical activity. Try not to strain or perform activities that cause pain. This includes any activities using your chest or your abdominal and side muscles, especially if heavy weights are used. °· Put ice on the sore area. °¨ Put ice in a plastic bag. °¨ Place a towel between your skin and the bag. °¨ Leave the ice on for 15-20 minutes per hour while awake for the first 2 days. °· Only take over-the-counter or prescription medicines for pain, discomfort, or fever as directed by your caregiver. °SEEK IMMEDIATE MEDICAL CARE IF:  °· Your pain increases, or you are very uncomfortable. °· You have a fever. °· Your chest pain becomes worse. °· You have new, unexplained symptoms. °· You have nausea or vomiting. °· You feel sweaty or lightheaded. °· You have a cough with phlegm (sputum), or you cough up blood. °MAKE SURE YOU:  °· Understand these instructions. °· Will watch your condition. °· Will get help right away if you are not doing well or get worse. °Document Released: 10/04/2005 Document Revised: 12/27/2011 Document Reviewed: 05/31/2011 °ExitCare® Patient Information ©2015 [employer]. This information is not intended to replace advice given to you by your health care provider. Make sure you discuss any questions you have with your health care provider. ° °

## 2015-02-16 NOTE — ED Notes (Signed)
Pt made aware to return if symptoms worsen or if any life threatening symptoms occur.   

## 2015-02-16 NOTE — ED Notes (Signed)
Patient c/o intermittent sharp, stabbing chest pain with shortness of breath that started 2 weeks ago. Denies any other symptoms-(cough, fever, nausea, vomiting etc.). Patient denies any cardiac hx.

## 2015-12-04 ENCOUNTER — Emergency Department (HOSPITAL_COMMUNITY)
Admission: EM | Admit: 2015-12-04 | Discharge: 2015-12-04 | Disposition: A | Discharge status: Home / Self Care | Payer: Medicaid Other | Attending: Emergency Medicine | Admitting: Emergency Medicine

## 2015-12-04 ENCOUNTER — Encounter (HOSPITAL_COMMUNITY): Payer: Self-pay | Admitting: Cardiology

## 2015-12-04 DIAGNOSIS — B001 Herpesviral vesicular dermatitis: Secondary | ICD-10-CM

## 2015-12-04 DIAGNOSIS — Z87891 Personal history of nicotine dependence: Secondary | ICD-10-CM | POA: Insufficient documentation

## 2015-12-04 DIAGNOSIS — J069 Acute upper respiratory infection, unspecified: Secondary | ICD-10-CM

## 2015-12-04 NOTE — ED Notes (Signed)
Blister to top lip .  Started today.

## 2015-12-04 NOTE — Discharge Instructions (Signed)
Cold Sore ABREVA, BLISTEX, OR CAMPHO-PHENIQUE MAY BE HELPFUL.                                                  A cold sore (fever blister) is a skin infection caused by the herpes simplex virus (HSV-1). HSV-1 is closely related to the virus that causes genital herpes (HSV-2), but they are not the same even though both viruses can cause oral and genital infections. Cold sores are small, fluid-filled sores inside of the mouth or on the lips, gums, nose, chin, cheeks, or fingers.  The herpes simplex virus can be easily passed (contagious) to other people through close personal contact, such as kissing or sharing personal items. The virus can also spread to other parts of the body, such as the eyes or genitals. Cold sores are contagious until the sores crust over completely. They often heal within 2 weeks.  Once a person is infected, the herpes simplex virus remains permanently in the body. Therefore, there is no cure for cold sores, and they often recur when a person is tired, stressed, sick, or gets too much sun. Additional factors that can cause a recurrence include hormone changes in menstruation or pregnancy, certain drugs, and cold weather.  CAUSES  Cold sores are caused by the herpes simplex virus. The virus is spread from person to person through close contact, such as through kissing, touching the affected area, or sharing personal items such as lip balm, razors, or eating utensils.  SYMPTOMS  The first infection may not cause symptoms. If symptoms develop, the symptoms often go through different stages. Here is how a cold sore develops:   Tingling, itching, or burning is felt 1-2 days before the outbreak.   Fluid-filled blisters appear on the lips, inside the mouth, nose, or on the cheeks.   The blisters start to ooze clear fluid.   The blisters dry up and a yellow crust appears in its place.   The crust falls off.  Symptoms depend on whether it is the initial outbreak or a recurrence.  Some other symptoms with the first outbreak may include:   Fever.   Sore throat.   Headache.   Muscle aches.   Swollen neck glands.  DIAGNOSIS  A diagnosis is often made based on your symptoms and looking at the sores. Sometimes, a sore may be swabbed and then examined in the lab to make a final diagnosis. If the sores are not present, blood tests can find the herpes simplex virus.  TREATMENT  There is no cure for cold sores and no vaccine for the herpes simplex virus. Within 2 weeks, most cold sores go away on their own without treatment. Medicines cannot make the infection go away, but medicine can help relieve some of the pain associated with the sores, can work to stop the virus from multiplying, and can also shorten healing time. Medicine may be in the form of creams, gels, pills, or a shot.  HOME CARE INSTRUCTIONS   Only take over-the-counter or prescription medicines for pain, discomfort, or fever as directed by your caregiver. Do not use aspirin.   Use a cotton-tip swab to apply creams or gels to your sores.   Do not touch the sores or pick the scabs. Wash your hands often. Do not touch your eyes without washing your hands first.  Avoid kissing, oral sex, and sharing personal items until sores heal.   Apply an ice pack on your sores for 10-15 minutes to ease any discomfort.   Avoid hot, cold, or salty foods because they may hurt your mouth. Eat a soft, bland diet to avoid irritating the sores. Use a straw to drink if you have pain when drinking out of a glass.   Keep sores clean and dry to prevent an infection of other tissues.   Avoid the sun and limit stress if these things trigger outbreaks. If sun causes cold sores, apply sunscreen on the lips before being out in the sun.  SEEK MEDICAL CARE IF:   You have a fever or persistent symptoms for more than 2-3 days.   You have a fever and your symptoms suddenly get worse.   You have pus, not clear fluid,  coming from the sores.   You have redness that is spreading.   You have pain or irritation in your eye.   You get sores on your genitals.   Your sores do not heal within 2 weeks.   You have a weakened immune system.   You have frequent recurrences of cold sores.  MAKE SURE YOU:   Understand these instructions.  Will watch your condition.  Will get help right away if you are not doing well or get worse.   This information is not intended to replace advice given to you by your health care provider. Make sure you discuss any questions you have with your health care provider.   Document Released: 10/01/2000 Document Revised: 10/25/2014 Document Reviewed: 02/16/2012 Elsevier Interactive Patient Education [employer]

## 2015-12-04 NOTE — ED Provider Notes (Signed)
CSN: [id number]     Arrival date & time 12/04/15  1836 History   First MD Initiated Contact with Patient 12/04/15 1925     Chief Complaint  Patient presents with  . Oral Swelling     (Consider location/radiation/quality/duration/timing/severity/associated sxs/prior Treatment) HPI Comments: Pt reports a "knot" of the upper lip. Onset this AM. No injury to the upper lip. No new foods or drink. No new medications. Pt has been having URI symptoms. Pt has not measured fever. Exposure to the flu. Several co-workers have colds. Pt has applied Chap-Stick, but no other  Treatment.  The history is provided by the patient.    History reviewed. No pertinent past medical history. History reviewed. No pertinent past surgical history. Family History  Problem Relation Age of Onset  . Cancer Father   . Seizures Other    Social History  Substance Use Topics  . Smoking status: Former Smoker    Types: Cigars    Quit date: 10/18/2013  . Smokeless tobacco: Never Used  . Alcohol Use: Yes     Comment: occasionally    Review of Systems  Constitutional: Negative for chills and activity change.       All ROS Neg except as noted in HPI  HENT: Positive for congestion and sore throat. Negative for nosebleeds.   Eyes: Negative for photophobia and discharge.  Respiratory: Positive for cough. Negative for shortness of breath and wheezing.   Cardiovascular: Negative for chest pain and palpitations.  Gastrointestinal: Negative for abdominal pain and blood in stool.  Genitourinary: Negative for dysuria, frequency and hematuria.  Musculoskeletal: Negative for back pain, arthralgias and neck pain.  Skin: Negative.   Neurological: Negative for dizziness, seizures and speech difficulty.  Psychiatric/Behavioral: Negative for hallucinations and confusion.      Allergies  Review of patient's allergies indicates no known allergies.  Home Medications   Prior to Admission medications   Medication Sig Start  Date End Date Taking? Authorizing Provider  amoxicillin (AMOXIL) 500 MG capsule Take 1 capsule (500 mg total) by mouth 3 (three) times daily. Patient not taking: Reported on 02/16/2015 10/30/14   Zadie Rhine, MD  ibuprofen (ADVIL,MOTRIN) 200 MG tablet Take 200 mg by mouth every 6 (six) hours as needed for mild pain or moderate pain.    Historical Provider, MD   BP 131/69 mmHg  Pulse 64  Temp(Src) 98.4 F (36.9 C) (Oral)  Resp 16  Ht 5' 6.5" (1.689 m)  Wt 74.844 kg  BMI 26.24 kg/m2  SpO2 99% Physical Exam  Constitutional: He is oriented to person, place, and time. He appears well-developed and well-nourished.  Non-toxic appearance.  HENT:  Head: Normocephalic.  Right Ear: Tympanic membrane and external ear normal.  Left Ear: Tympanic membrane and external ear normal.  Nasal congestion present. Cold sore of the right upper lip. No oral lesions noted.  Eyes: EOM and lids are normal. Pupils are equal, round, and reactive to light.  Neck: Normal range of motion. Neck supple. Carotid bruit is not present.  Cardiovascular: Normal rate, regular rhythm, normal heart sounds, intact distal pulses and normal pulses.   Pulmonary/Chest: Breath sounds normal. No respiratory distress.  Abdominal: Soft. Bowel sounds are normal. There is no tenderness. There is no guarding.  Musculoskeletal: Normal range of motion.  Lymphadenopathy:       Head (right side): No submandibular adenopathy present.       Head (left side): No submandibular adenopathy present.    He has no cervical adenopathy.  Neurological: He is alert and oriented to person, place, and time. He has normal strength. No cranial nerve deficit or sensory deficit.  Skin: Skin is warm and dry.  Psychiatric: He has a normal mood and affect. His speech is normal.  Nursing note and vitals reviewed.   ED Course  Procedures (including critical care time) Labs Review Labs Reviewed - No data to display  Imaging Review No results found. I  have personally reviewed and evaluated these images and lab results as part of my medical decision-making.   EKG Interpretation None      MDM  Vital signs are well within normal limits. Pulse oximetry is 99% on room air. Within normal limits by my interpretation.  Pt has a cold sore present. Discussed the symptoms of a viral illness. Pt to try Blistex or other cold sore medications. Discussed the importance of hand washing and keeping his distance from others.   Final diagnoses:  None    *I have reviewed nursing notes, vital signs, and all appropriate lab and imaging results for this patient.**    Ivery Quale, PA-C 12/04/15 1956  Raeford Razor, MD 12/06/15 1600

## 2017-04-15 ENCOUNTER — Emergency Department (HOSPITAL_COMMUNITY)
Admission: EM | Admit: 2017-04-15 | Discharge: 2017-04-15 | Disposition: A | Discharge status: Home / Self Care | Payer: 59 | Attending: Emergency Medicine | Admitting: Emergency Medicine

## 2017-04-15 ENCOUNTER — Encounter (HOSPITAL_COMMUNITY): Payer: Self-pay | Admitting: *Deleted

## 2017-04-15 DIAGNOSIS — R21 Rash and other nonspecific skin eruption: Secondary | ICD-10-CM | POA: Diagnosis present

## 2017-04-15 DIAGNOSIS — F17290 Nicotine dependence, other tobacco product, uncomplicated: Secondary | ICD-10-CM | POA: Diagnosis not present

## 2017-04-15 DIAGNOSIS — L259 Unspecified contact dermatitis, unspecified cause: Secondary | ICD-10-CM | POA: Insufficient documentation

## 2017-04-15 MED ORDER — ONDANSETRON HCL 4 MG PO TABS
4.0000 mg | ORAL_TABLET | Freq: Once | ORAL | Status: AC
  Administered 2017-04-15: 4 mg via ORAL
  Filled 2017-04-15: qty 1

## 2017-04-15 MED ORDER — PREDNISONE 50 MG PO TABS
60.0000 mg | ORAL_TABLET | Freq: Once | ORAL | Status: AC
  Administered 2017-04-15: 60 mg via ORAL
  Filled 2017-04-15: qty 1

## 2017-04-15 MED ORDER — LORATADINE 10 MG PO TABS
10.0000 mg | ORAL_TABLET | Freq: Once | ORAL | Status: AC
  Administered 2017-04-15: 10 mg via ORAL
  Filled 2017-04-15: qty 1

## 2017-04-15 MED ORDER — DEXAMETHASONE 4 MG PO TABS: 4.0000 mg | ORAL_TABLET | Freq: Two times a day (BID) | ORAL | 0 refills | Status: DC

## 2017-04-15 MED ORDER — TRIAMCINOLONE ACETONIDE 0.1 % EX CREA: 1.0000 "application " | TOPICAL_CREAM | Freq: Two times a day (BID) | CUTANEOUS | 0 refills | Status: DC

## 2017-04-15 NOTE — ED Provider Notes (Signed)
[facility]-EMERGENCY DEPT Provider Note   CSN: [id number] Arrival date & time: 04/15/17  1955     History   Chief Complaint Chief Complaint  Patient presents with  . Rash    HPI Kristopher Sharp is a 36 y.o. male.  Patient is a 36 year old male who presents to the emergency department with a complaint of rash on both arms.  The patient states that he uses long gloves at his job. He thinks that someone who had a similar rash and use the) 40 but them on. He has now been suffering from a rash on both arms for approximately a week. He says he has tried some warm soaks and alcohol, but this has not been effective. He presents now because he says from time to time the itching seems to be getting a little worse.   The history is provided by the patient.  Rash      History reviewed. No pertinent past medical history.  There are no active problems to display for this patient.   History reviewed. No pertinent surgical history.     Home Medications    Prior to Admission medications   Medication Sig Start Date End Date Taking? Authorizing Provider  amoxicillin (AMOXIL) 500 MG capsule Take 1 capsule (500 mg total) by mouth 3 (three) times daily. Patient not taking: Reported on 02/16/2015 10/30/14   Zadie Rhine, MD  dexamethasone (DECADRON) 4 MG tablet Take 1 tablet (4 mg total) by mouth 2 (two) times daily with a meal. 04/15/17   Ivery Quale, PA-C  ibuprofen (ADVIL,MOTRIN) 200 MG tablet Take 200 mg by mouth every 6 (six) hours as needed for mild pain or moderate pain.    [provider]  triamcinolone cream (KENALOG) 0.1 % Apply 1 application topically 2 (two) times daily. 04/15/17   Ivery Quale, PA-C    Family History Family History  Problem Relation Age of Onset  . Cancer Father   . Seizures Other     Social History Social History  Substance Use Topics  . Smoking status: Current Some Day Smoker    Types: Cigars    Last attempt to quit: 10/18/2013  .  Smokeless tobacco: Never Used  . Alcohol use Yes     Comment: occasionally     Allergies   Patient has no known allergies.   Review of Systems Review of Systems  Constitutional: Negative for activity change.       All ROS Neg except as noted in HPI  HENT: Negative for nosebleeds.   Eyes: Negative for photophobia and discharge.  Respiratory: Negative for cough, shortness of breath and wheezing.   Cardiovascular: Negative for chest pain and palpitations.  Gastrointestinal: Negative for abdominal pain and blood in stool.  Genitourinary: Negative for dysuria, frequency and hematuria.  Musculoskeletal: Negative for arthralgias, back pain and neck pain.  Skin: Positive for rash.  Neurological: Negative for dizziness, seizures and speech difficulty.  Psychiatric/Behavioral: Negative for confusion and hallucinations.     Physical Exam Updated Vital Signs BP 118/65 (BP Location: Left Arm)   Pulse 69   Temp 98.8 F (37.1 C) (Oral)   Resp 18   Ht 5' 6.5" (1.689 m)   Wt 77.1 kg (170 lb)   SpO2 97%   BMI 27.03 kg/m   Physical Exam  Constitutional: Vital signs are normal. He appears well-developed and well-nourished. He is active.  HENT:  Head: Normocephalic and atraumatic.  Right Ear: Tympanic membrane, external ear and ear canal normal.  Left Ear: Tympanic membrane, external ear and ear canal normal.  Nose: Nose normal.  Mouth/Throat: Uvula is midline, oropharynx is clear and moist and mucous membranes are normal.  Eyes: Conjunctivae, EOM and lids are normal. Pupils are equal, round, and reactive to light.  Neck: Trachea normal, normal range of motion and phonation normal. Neck supple. Carotid bruit is not present.  Cardiovascular: Normal rate, regular rhythm and normal pulses.   Pulmonary/Chest: Effort normal and breath sounds normal.  Abdominal: Soft. Normal appearance and bowel sounds are normal.  Musculoskeletal: Normal range of motion. He exhibits no tenderness.    Lymphadenopathy:       Head (right side): No submental, no preauricular and no posterior auricular adenopathy present.       Head (left side): No submental, no preauricular and no posterior auricular adenopathy present.    He has no cervical adenopathy.  Neurological: He is alert. He has normal strength. No cranial nerve deficit or sensory deficit. GCS eye subscore is 4. GCS verbal subscore is 5. GCS motor subscore is 6.  Skin: Skin is warm and dry.  There is a papular rash on the dorsum of both hands extending to the elbow bilaterally. There no red streaks appreciated. No drainage or noted. There is no rash in the palm of the hands. There is no rash in the web spaces between the fingers.  Psychiatric: His speech is normal.     ED Treatments / Results  Labs (all labs ordered are listed, but only abnormal results are displayed) Labs Reviewed - No data to display  EKG  EKG Interpretation None       Radiology No results found.  Procedures Procedures (including critical care time)  Medications Ordered in ED Medications  predniSONE (DELTASONE) tablet 60 mg (not administered)  ondansetron (ZOFRAN) tablet 4 mg (not administered)  loratadine (CLARITIN) tablet 10 mg (not administered)     Initial Impression / Assessment and Plan / ED Course  I have reviewed the triage vital signs and the nursing notes.  Pertinent labs & imaging results that were available during my care of the patient were reviewed by me and considered in my medical decision making (see chart for details).       Final Clinical Impressions(s) / ED Diagnoses MDM I will instructed the patient that this is probably a contact dermatitis. I've asked him to try an alternative type of glove to see if he is sensitive to his current gloves, or if it was related to the rash that his coworker had also used his gloves. The patient will be treated with triamcinolone and Decadron. The patient will use Claritin during the  day, and/or ibuprofen at bedtime if needed for itching. Patient will see dermatology for additional evaluation if not improving. Patient is in agreement with this plan.    Final diagnoses:  Contact dermatitis, unspecified contact dermatitis type, unspecified trigger    New Prescriptions New Prescriptions   DEXAMETHASONE (DECADRON) 4 MG TABLET    Take 1 tablet (4 mg total) by mouth 2 (two) times daily with a meal.   TRIAMCINOLONE CREAM (KENALOG) 0.1 %    Apply 1 application topically 2 (two) times daily.     Ivery QualeBryant, Amjad Fikes, PA-C 04/15/17 2057    Lavera GuiseLiu, Dana Duo, MD 04/19/17 [phone]

## 2017-04-15 NOTE — ED Triage Notes (Signed)
Pt reports having a rash on bilateral lower arms x 1 week. Pt states he wore a long pair of gloves at work that had been worn by another co-worker who had the same rash.

## 2017-04-15 NOTE — Discharge Instructions (Signed)
If possible, please tried different type of glove on. This will help you to decide if you are allergic to the gloves that you're currently using. Please apply triamcinolone cream 2 times daily. Use Decadron 2 times daily with food. Use Claritin each morning, may use Benadryl at bedtime if needed for itching. Please see Kristopher Sharp for dermatology evaluation if this is not effective.

## 2018-07-17 ENCOUNTER — Other Ambulatory Visit: Payer: Self-pay

## 2018-07-17 ENCOUNTER — Encounter (HOSPITAL_COMMUNITY): Payer: Self-pay

## 2018-07-17 ENCOUNTER — Emergency Department (HOSPITAL_COMMUNITY)
Admission: EM | Admit: 2018-07-17 | Discharge: 2018-07-18 | Disposition: A | Discharge status: Home / Self Care | Payer: 59 | Attending: Emergency Medicine | Admitting: Emergency Medicine

## 2018-07-17 DIAGNOSIS — R05 Cough: Secondary | ICD-10-CM | POA: Diagnosis present

## 2018-07-17 DIAGNOSIS — F17290 Nicotine dependence, other tobacco product, uncomplicated: Secondary | ICD-10-CM | POA: Insufficient documentation

## 2018-07-17 DIAGNOSIS — J069 Acute upper respiratory infection, unspecified: Secondary | ICD-10-CM | POA: Diagnosis not present

## 2018-07-17 DIAGNOSIS — B9789 Other viral agents as the cause of diseases classified elsewhere: Secondary | ICD-10-CM | POA: Diagnosis not present

## 2018-07-17 NOTE — ED Notes (Signed)
ED Provider at bedside. 

## 2018-07-17 NOTE — ED Provider Notes (Signed)
[facility] EMERGENCY DEPARTMENT Provider Note   CSN: [id number] Arrival date & time: 07/17/18  2325     History   Chief Complaint Chief Complaint  Patient presents with  . Cough    HPI Kristopher Sharp is a 37 y.o. male.  Patient reports that he has been sick today.  He had nausea without vomiting, diarrhea through the day.  This afternoon he developed a dry, nonproductive cough.  He has had headache, ear pain and sore throat.  He denies fever.     History reviewed. No pertinent past medical history.  There are no active problems to display for this patient.   History reviewed. No pertinent surgical history.      Home Medications    Prior to Admission medications   Medication Sig Start Date End Date Taking? Authorizing Provider  benzonatate (TESSALON) 100 MG capsule Take 1 capsule (100 mg total) by mouth every 8 (eight) hours. 07/18/18   Gilda Crease, MD  ondansetron (ZOFRAN) 4 MG tablet Take 1 tablet (4 mg total) by mouth every 6 (six) hours. 07/18/18   Gilda Crease, MD    Family History Family History  Problem Relation Age of Onset  . Cancer Father   . Seizures Other     Social History Social History   Tobacco Use  . Smoking status: Current Some Day Smoker    Types: Cigars    Last attempt to quit: 10/18/2013    Years since quitting: 4.7  . Smokeless tobacco: Never Used  Substance Use Topics  . Alcohol use: Yes    Comment: occasionally  . Drug use: No     Allergies   Patient has no known allergies.   Review of Systems Review of Systems  HENT: Positive for congestion, ear pain and sore throat.   Respiratory: Positive for cough.   Neurological: Positive for headaches.  All other systems reviewed and are negative.    Physical Exam Updated Vital Signs BP 133/81   Pulse 74   Temp 98 F (36.7 C) (Oral)   Resp 18   Ht 5\' 6"  (1.676 m)   Wt 77.1 kg   SpO2 99%   BMI 27.44 kg/m   Physical Exam  Constitutional: He is  oriented to person, place, and time. He appears well-developed and well-nourished. No distress.  HENT:  Head: Normocephalic and atraumatic.  Right Ear: Hearing, tympanic membrane and ear canal normal.  Left Ear: Hearing, tympanic membrane and ear canal normal.  Nose: Nose normal.  Mouth/Throat: Mucous membranes are normal. Posterior oropharyngeal erythema present. No tonsillar abscesses. Tonsils are 2+ on the right. Tonsils are 2+ on the left.  Eyes: Pupils are equal, round, and reactive to light. Conjunctivae and EOM are normal.  Neck: Normal range of motion. Neck supple. No Brudzinski's sign and no Kernig's sign noted.  Cardiovascular: Regular rhythm, S1 normal and S2 normal. Exam reveals no gallop and no friction rub.  No murmur heard. Pulmonary/Chest: Effort normal and breath sounds normal. No respiratory distress. He exhibits no tenderness.  Abdominal: Soft. Normal appearance and bowel sounds are normal. There is no hepatosplenomegaly. There is no tenderness. There is no rebound, no guarding, no tenderness at McBurney's point and negative Murphy's sign. No hernia.  Musculoskeletal: Normal range of motion.  Neurological: He is alert and oriented to person, place, and time. He has normal strength. No cranial nerve deficit or sensory deficit. Coordination normal. GCS eye subscore is 4. GCS verbal subscore is 5. GCS motor  subscore is 6.  Skin: Skin is warm, dry and intact. No rash noted. No cyanosis.  Psychiatric: He has a normal mood and affect. His speech is normal and behavior is normal. Thought content normal.  Nursing note and vitals reviewed.    ED Treatments / Results  Labs (all labs ordered are listed, but only abnormal results are displayed) Labs Reviewed  GROUP A STREP BY PCR    EKG None  Radiology No results found.  Procedures Procedures (including critical care time)  Medications Ordered in ED Medications - No data to display   Initial Impression / Assessment and  Plan / ED Course  I have reviewed the triage vital signs and the nursing notes.  Pertinent labs & imaging results that were available during my care of the patient were reviewed by me and considered in my medical decision making (see chart for details).     Presents to the emergency department for evaluation of upper respiratory infection symptoms.  Symptoms began earlier today.  He did have some diarrhea earlier, abdominal exam is benign.  Vital signs are normal.  He is breathing comfortably, lungs are clear.  No clinical signs of pneumonia.  No hypoxia.  Symptoms are most consistent with a viral etiology.  Strep was negative.  Will treat symptomatically.  Final Clinical Impressions(s) / ED Diagnoses   Final diagnoses:  Viral URI with cough    ED Discharge Orders         Ordered    benzonatate (TESSALON) 100 MG capsule  Every 8 hours     07/18/18 0042    ondansetron (ZOFRAN) 4 MG tablet  Every 6 hours     07/18/18 0042           Gilda Crease, MD 07/18/18 [phone]

## 2018-07-17 NOTE — ED Triage Notes (Signed)
Coughing since 1400hrs. Pt just took "cold daytime med"

## 2018-07-18 LAB — GROUP A STREP BY PCR: GROUP A STREP BY PCR: NOT DETECTED

## 2018-07-18 MED ORDER — BENZONATATE 100 MG PO CAPS: 100.0000 mg | ORAL_CAPSULE | Freq: Three times a day (TID) | ORAL | 0 refills | Status: DC

## 2018-07-18 MED ORDER — ONDANSETRON HCL 4 MG PO TABS: 4.0000 mg | ORAL_TABLET | Freq: Four times a day (QID) | ORAL | 0 refills | Status: DC

## 2021-04-05 ENCOUNTER — Emergency Department (HOSPITAL_COMMUNITY): Payer: Self-pay

## 2021-04-05 ENCOUNTER — Other Ambulatory Visit: Payer: Self-pay

## 2021-04-05 ENCOUNTER — Emergency Department (HOSPITAL_COMMUNITY)
Admission: EM | Admit: 2021-04-05 | Discharge: 2021-04-05 | Disposition: A | Discharge status: Home / Self Care | Payer: Self-pay | Attending: Emergency Medicine | Admitting: Emergency Medicine

## 2021-04-05 ENCOUNTER — Encounter (HOSPITAL_COMMUNITY): Payer: Self-pay | Admitting: Emergency Medicine

## 2021-04-05 DIAGNOSIS — F17290 Nicotine dependence, other tobacco product, uncomplicated: Secondary | ICD-10-CM | POA: Insufficient documentation

## 2021-04-05 DIAGNOSIS — S6991XA Unspecified injury of right wrist, hand and finger(s), initial encounter: Secondary | ICD-10-CM | POA: Insufficient documentation

## 2021-04-05 DIAGNOSIS — W228XXA Striking against or struck by other objects, initial encounter: Secondary | ICD-10-CM | POA: Insufficient documentation

## 2021-04-05 NOTE — ED Triage Notes (Signed)
Pt arrives POV for R hand pain after punching a door. Pt has swelling to R hand and wrist, 6/10 pain, + pulses, sensation and movement.
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
## 2021-04-05 NOTE — Discharge Instructions (Addendum)
Please read instructions below. Apply ice to your hand for 20 minutes at a time. You can take ibuprofen every 6 hours as needed for pain. Schedule an appointment with the orthopedic specialist in 1-2 weeks for repeat x-ray and follow-up on your injury. Return to the ER for new or concerning symptoms.

## 2021-04-05 NOTE — ED Notes (Signed)
Patient verbalizes understanding of discharge instructions. Opportunity for questioning and answers were provided. Armband removed by staff, pt discharged from ED and ambulated to lobby to return home with family.  

## 2021-04-05 NOTE — ED Provider Notes (Signed)
Emergency Medicine Provider Triage Evaluation Note  JARRICK FJELD , a 40 y.o. RHD male  was evaluated in triage.  Pt complains of right hand pain and swelling after punching a door last night.  Took Aleve prior to arrival.  He applied cold water bottle which helpes with swelling..  Review of Systems  Positive: Right hand pain and swelling Negative: Open wound, numbness, tingling  Physical Exam  BP 138/88 (BP Location: Left Arm)   Pulse 77   Temp 99 F (37.2 C) (Oral)   Resp 16   SpO2 99%  Gen:   Awake, no distress   Resp:  Normal effort  MSK:   Moves extremities without difficulty  Other:  Swelling noted to dorsum of right hand.  No open wounds.  Brisk cap refill.  Strong radial pulse  Medical Decision Making  Medically screening exam initiated at 7:13 AM.  Appropriate orders placed.  Kalix L Dimalanta was informed that the remainder of the evaluation will be completed by another provider, this initial triage assessment does not replace that evaluation, and the importance of remaining in the ED until their evaluation is complete.  Xray of right hand and wrist ordered. Patient declines needs for pain medicine.   Portions of this note were generated with Scientist, clinical (histocompatibility and immunogenetics). Dictation errors may occur despite best attempts at proofreading.    Shanon Ace, PA-C 04/05/21 5732    Eber Hong, MD 04/07/21 [phone]

## 2021-09-08 ENCOUNTER — Encounter (HOSPITAL_COMMUNITY): Payer: Self-pay

## 2021-09-08 ENCOUNTER — Other Ambulatory Visit: Payer: Self-pay

## 2021-09-08 ENCOUNTER — Emergency Department (HOSPITAL_COMMUNITY)
Admission: EM | Admit: 2021-09-08 | Discharge: 2021-09-08 | Disposition: A | Discharge status: Home / Self Care | Payer: No Typology Code available for payment source | Attending: Student | Admitting: Student

## 2021-09-08 DIAGNOSIS — B349 Viral infection, unspecified: Secondary | ICD-10-CM

## 2021-09-08 DIAGNOSIS — R059 Cough, unspecified: Secondary | ICD-10-CM | POA: Diagnosis present

## 2021-09-08 DIAGNOSIS — F17210 Nicotine dependence, cigarettes, uncomplicated: Secondary | ICD-10-CM | POA: Diagnosis not present

## 2021-09-08 DIAGNOSIS — Z20822 Contact with and (suspected) exposure to covid-19: Secondary | ICD-10-CM | POA: Insufficient documentation

## 2021-09-08 DIAGNOSIS — J101 Influenza due to other identified influenza virus with other respiratory manifestations: Secondary | ICD-10-CM | POA: Diagnosis not present

## 2021-09-08 LAB — RESP PANEL BY RT-PCR (FLU A&B, COVID) ARPGX2
Influenza A by PCR: POSITIVE — AB
Influenza B by PCR: NEGATIVE
SARS Coronavirus 2 by RT PCR: NEGATIVE

## 2021-09-08 MED ORDER — BENZONATATE 100 MG PO CAPS: 100.0000 mg | ORAL_CAPSULE | Freq: Three times a day (TID) | ORAL | 0 refills | Status: DC

## 2021-09-08 MED ORDER — ACETAMINOPHEN 500 MG PO TABS
1000.0000 mg | ORAL_TABLET | Freq: Once | ORAL | Status: AC
  Administered 2021-09-08: 1000 mg via ORAL
  Filled 2021-09-08: qty 2

## 2021-09-08 NOTE — Discharge Instructions (Signed)
You are seen in the emergency department today for viral-like symptoms.  While you were here we sent off COVID and flu testing.  We also gave you some Tylenol for your fever.  Please stay home until you are fever free.  Please check your MyChart for the results of your lab work.  Please return to the emergency department if you begin having shortness of breath or chest pain or your fevers do not respond to medication.

## 2021-09-08 NOTE — ED Provider Notes (Signed)
[facility] EMERGENCY DEPARTMENT Provider Note   CSN: [id number] Arrival date & time: 09/08/21  1300     History No chief complaint on file.   THEODIS KINSEL is a 40 y.o. male.  With no significant past medical history presents emergency department for flulike symptoms.  He states that for about 3 days he has had body aches, hot and cold chills, cough, sore throat, headache and rhinorrhea.  He states that his son was sick last week, but was not tested.  He states that he has had some nausea without vomiting.  He denies abdominal pain or diarrhea.  He denies chest pain or shortness of breath.  He has no past medical history of asthma or lung disease.  HPI     No past medical history on file.  There are no problems to display for this patient.   No past surgical history on file.     Family History  Problem Relation Age of Onset   Cancer Father    Seizures Other     Social History   Tobacco Use   Smoking status: Some Days    Types: Cigars    Last attempt to quit: 10/18/2013    Years since quitting: 7.8   Smokeless tobacco: Never  Vaping Use   Vaping Use: Never used  Substance Use Topics   Alcohol use: Yes    Comment: occasionally   Drug use: Yes    Frequency: 7.0 times per week    Types: Marijuana    Home Medications Prior to Admission medications   Medication Sig Start Date End Date Taking? Authorizing Provider  benzonatate (TESSALON) 100 MG capsule Take 1 capsule (100 mg total) by mouth every 8 (eight) hours. 07/18/18   Gilda Crease, MD  ondansetron (ZOFRAN) 4 MG tablet Take 1 tablet (4 mg total) by mouth every 6 (six) hours. 07/18/18   Gilda Crease, MD    Allergies    Patient has no known allergies.  Review of Systems   Review of Systems  Constitutional:  Positive for chills and fever.  HENT:  Positive for rhinorrhea and sore throat.   Eyes:  Positive for redness.  Respiratory:  Positive for cough. Negative for  shortness of breath.   Cardiovascular:  Negative for chest pain and palpitations.  Gastrointestinal:  Positive for nausea. Negative for abdominal pain and vomiting.  All other systems reviewed and are negative.  Physical Exam Updated Vital Signs BP 122/84 (BP Location: Left Arm)   Pulse (!) 120   Temp (!) 102.7 F (39.3 C)   Resp (!) 22   SpO2 95%   Physical Exam Vitals and nursing note reviewed.  Constitutional:      General: He is not in acute distress.    Appearance: Normal appearance. He is ill-appearing. He is not toxic-appearing.  HENT:     Head: Normocephalic and atraumatic.     Nose: Congestion present.     Mouth/Throat:     Mouth: Mucous membranes are moist.     Pharynx: Posterior oropharyngeal erythema present.  Eyes:     General: No scleral icterus.    Extraocular Movements: Extraocular movements intact.     Pupils: Pupils are equal, round, and reactive to light.  Cardiovascular:     Rate and Rhythm: Tachycardia present.     Pulses: Normal pulses.     Heart sounds: No murmur heard. Pulmonary:     Effort: Pulmonary effort is normal. No respiratory distress.  Breath sounds: Normal breath sounds.  Abdominal:     Palpations: Abdomen is soft.  Musculoskeletal:        General: Normal range of motion.     Cervical back: Normal range of motion and neck supple. No tenderness.  Lymphadenopathy:     Cervical: Cervical adenopathy present.  Skin:    General: Skin is warm and dry.     Capillary Refill: Capillary refill takes less than 2 seconds.     Findings: No rash.  Neurological:     General: No focal deficit present.     Mental Status: He is alert and oriented to person, place, and time. Mental status is at baseline.  Psychiatric:        Mood and Affect: Mood normal.        Behavior: Behavior normal.        Thought Content: Thought content normal.        Judgment: Judgment normal.    ED Results / Procedures / Treatments   Labs (all labs ordered are  listed, but only abnormal results are displayed) Labs Reviewed  RESP PANEL BY RT-PCR (FLU A&B, COVID) ARPGX2   EKG None  Radiology No results found.  Procedures Procedures   Medications Ordered in ED Medications  acetaminophen (TYLENOL) tablet 1,000 mg (has no administration in time range)    ED Course  I have reviewed the triage vital signs and the nursing notes.  Pertinent labs & imaging results that were available during my care of the patient were reviewed by me and considered in my medical decision making (see chart for details).    MDM Rules/Calculators/A&P 40 year old male who presents emergency department with viral-like symptoms  This patient presents with symptoms suspicious for likely viral upper respiratory infection. Based on history and physical doubt sinusitis. COVID and flu test was sent off and pending. Do not suspect underlying cardiopulmonary process. I considered, but think unlikely, dangerous causes of this patient's symptoms to include ACS, CHF or COPD exacerbations, pneumonia, pneumothorax. Patient is ill-appearing however nontoxic appearing and not in need of emergent medical intervention.  He is not short of breath or having chest pain.  He is tachycardic which is likely fever driven.  I have given him 1 g of Tylenol you are in the emergency department.  Patient told to self isolate at home until symptoms subside for 72 hours.  He is instructed to use MyChart to follow-up on his flu and COVID testing.  He is instructed to use Tylenol and Motrin as needed for symptomatic relief of symptoms.  I am sending him with a prescription for Tessalon Perles for cough.  Instructed return to emergency department should he have shortness of breath or worsening fever that does not respond to medication. He is amenable to being discharged and following up for his results. A for discharge Final Clinical Impression(s) / ED Diagnoses Final diagnoses:  Viral illness    Rx / DC  Orders ED Discharge Orders          Ordered    benzonatate (TESSALON) 100 MG capsule  Every 8 hours        09/08/21 1331             Cristopher Peru, PA-C 09/08/21 1331    Gloris Manchester, MD 09/08/21 1946

## 2021-09-08 NOTE — ED Triage Notes (Signed)
Pt c/o generalized body aches, HA, chills, cough since yesterday.

## 2021-10-08 ENCOUNTER — Other Ambulatory Visit: Payer: Self-pay

## 2021-10-08 ENCOUNTER — Ambulatory Visit (HOSPITAL_COMMUNITY)
Admission: EM | Admit: 2021-10-08 | Discharge: 2021-10-08 | Disposition: A | Discharge status: Home / Self Care | Payer: No Typology Code available for payment source | Attending: Student | Admitting: Student

## 2021-10-08 ENCOUNTER — Encounter (HOSPITAL_COMMUNITY): Payer: Self-pay | Admitting: Emergency Medicine

## 2021-10-08 DIAGNOSIS — H1031 Unspecified acute conjunctivitis, right eye: Secondary | ICD-10-CM

## 2021-10-08 MED ORDER — POLYMYXIN B-TRIMETHOPRIM 10000-0.1 UNIT/ML-% OP SOLN: 1.0000 [drp] | OPHTHALMIC | 0 refills | Status: AC

## 2021-10-08 NOTE — ED Triage Notes (Signed)
Pt is present today with right eye irritation. Pt states he noticed the irritation near thanksgiving. Pt states that he feels like something is it his eye and has slight blurriness

## 2021-10-08 NOTE — ED Provider Notes (Signed)
[place]    CSN: [id number] Arrival date & time: 10/08/21  1109      History   Chief Complaint Chief Complaint  Patient presents with   Eye Problem    HPI Kristopher Sharp is a 40 y.o. male presenting with R eye irritation x1 month. Medical history noncontributory. Doesn't wear contacts or glasses. Redness, foreign body sensation, scant discharge. Symptoms initially also with photophobia which has resolved. Symptoms seemed to improve but then returned again with redness and mildly blurred vision.  Denies vision loss, eye pain, eye pain with movement, trauma to the eye.  HPI  History reviewed. No pertinent past medical history.  There are no problems to display for this patient.   History reviewed. No pertinent surgical history.     Home Medications    Prior to Admission medications   Medication Sig Start Date End Date Taking? Authorizing Provider  trimethoprim-polymyxin b (POLYTRIM) ophthalmic solution Place 1 drop into the right eye every 4 (four) hours for 7 days. 10/08/21 10/15/21 Yes Hazel Sams, PA-C  benzonatate (TESSALON) 100 MG capsule Take 1 capsule (100 mg total) by mouth every 8 (eight) hours. 09/08/21   Mickie Hillier, PA-C  ondansetron (ZOFRAN) 4 MG tablet Take 1 tablet (4 mg total) by mouth every 6 (six) hours. 07/18/18   Orpah Greek, MD    Family History Family History  Problem Relation Age of Onset   Cancer Father    Seizures Other     Social History Social History   Tobacco Use   Smoking status: Some Days    Types: Cigars    Last attempt to quit: 10/18/2013    Years since quitting: 7.9   Smokeless tobacco: Never  Vaping Use   Vaping Use: Never used  Substance Use Topics   Alcohol use: Yes    Comment: occasionally   Drug use: Yes    Frequency: 7.0 times per week    Types: Marijuana     Allergies   Patient has no known allergies.   Review of Systems Review of Systems  Eyes:  Positive for redness.  Negative for photophobia, pain, discharge, itching and visual disturbance.  All other systems reviewed and are negative.   Physical Exam Triage Vital Signs ED Triage Vitals  Enc Vitals Group     BP 10/08/21 1139 (!) 156/81     Pulse Rate 10/08/21 1139 70     Resp 10/08/21 1139 17     Temp 10/08/21 1139 98.3 F (36.8 C)     Temp Source 10/08/21 1139 Oral     SpO2 10/08/21 1139 97 %     Weight --      Height --      Head Circumference --      Peak Flow --      Pain Score 10/08/21 1140 0     Pain Loc --      Pain Edu? --      Excl. in [place]? --    No data found.  Updated Vital Signs BP (!) 156/81    Pulse 70    Temp 98.3 F (36.8 C) (Oral)    Resp 17    SpO2 97%   Visual Acuity Right Eye Distance: 20/50 Left Eye Distance: 20/40 Bilateral Distance: 20/25  Right Eye Near:   Left Eye Near:    Bilateral Near:     Physical Exam Vitals reviewed.  Constitutional:      Appearance: Normal appearance.  HENT:     Head: Normocephalic and atraumatic.     Right Ear: Tympanic membrane, ear canal and external ear normal. There is no impacted cerumen.     Left Ear: Tympanic membrane, ear canal and external ear normal. There is no impacted cerumen.     Nose: Nose normal. No congestion.     Mouth/Throat:     Pharynx: Oropharynx is clear. No posterior oropharyngeal erythema.  Eyes:     General: Lids are normal. Lids are everted, no foreign bodies appreciated. Vision grossly intact. Gaze aligned appropriately. No visual field deficit.       Right eye: No foreign body, discharge or hordeolum.        Left eye: No foreign body, discharge or hordeolum.     Extraocular Movements: Extraocular movements intact.     Right eye: Normal extraocular motion and no nystagmus.     Left eye: Normal extraocular motion and no nystagmus.     Conjunctiva/sclera:     Right eye: Right conjunctiva is injected. No chemosis, exudate or hemorrhage.    Left eye: Left conjunctiva is not injected. No chemosis,  exudate or hemorrhage.    Pupils: Pupils are equal, round, and reactive to light.     Right eye: No corneal abrasion or fluorescein uptake. Seidel exam negative.     Visual Fields: Right eye visual fields normal and left eye visual fields normal.     Comments: R conjunctival injection. PERRLA, EOMI without pain. No orbital tenderness. Visual acuity intact.  No fluorescein uptake.  Cardiovascular:     Rate and Rhythm: Normal rate and regular rhythm.     Heart sounds: Normal heart sounds.  Pulmonary:     Effort: Pulmonary effort is normal.     Breath sounds: Normal breath sounds.  Neurological:     General: No focal deficit present.     Mental Status: He is alert.  Psychiatric:        Mood and Affect: Mood normal.        Behavior: Behavior normal.        Thought Content: Thought content normal.        Judgment: Judgment normal.     UC Treatments / Results  Labs (all labs ordered are listed, but only abnormal results are displayed) Labs Reviewed - No data to display  EKG   Radiology No results found.  Procedures Procedures (including critical care time)  Medications Ordered in UC Medications - No data to display  Initial Impression / Assessment and Plan / UC Course  I have reviewed the triage vital signs and the nursing notes.  Pertinent labs & imaging results that were available during my care of the patient were reviewed by me and considered in my medical decision making (see chart for details).     This patient is a very pleasant 40 y.o. year old male presenting with R conjunctivitis. Visual acuity intact. Suspect this was a corneal abrasion that is now infected, given duration of symptoms (1 month). No fluorescein uptake today on exam. Polytrim drops sent. He does not wear contacts or glasses. ED return precautions discussed. Patient verbalizes understanding and agreement.  .   Final Clinical Impressions(s) / UC Diagnoses   Final diagnoses:  Acute conjunctivitis  of right eye, unspecified acute conjunctivitis type     Discharge Instructions      -Use the Polytrim drops for your conjunctivitis.  Put 1 drop in the affected eye every 4 hours while awake for 7  days.   -You can also use warm compresses 1-2 times daily. -If your symptoms get worse instead of better, follow-up immediately with ophthalmologist or other eye doctor.  This includes vision changes, eyelid swelling, pain with movement, worsening of redness despite treatment.      ED Prescriptions     Medication Sig Dispense Auth. Provider   trimethoprim-polymyxin b (POLYTRIM) ophthalmic solution Place 1 drop into the right eye every 4 (four) hours for 7 days. 10 mL Rhys Martini, PA-C      PDMP not reviewed this encounter.   Rhys Martini, PA-C 10/08/21 1325

## 2021-10-08 NOTE — Discharge Instructions (Addendum)
-  Use the Polytrim drops for your conjunctivitis.  Put 1 drop in the affected eye every 4 hours while awake for 7 days.   -You can also use warm compresses 1-2 times daily. -If your symptoms get worse instead of better, follow-up immediately with ophthalmologist or other eye doctor.  This includes vision changes, eyelid swelling, pain with movement, worsening of redness despite treatment.  

## 2021-10-23 ENCOUNTER — Other Ambulatory Visit: Payer: Self-pay

## 2021-10-23 ENCOUNTER — Ambulatory Visit (HOSPITAL_COMMUNITY)
Admission: EM | Admit: 2021-10-23 | Discharge: 2021-10-23 | Disposition: A | Discharge status: Home / Self Care | Payer: No Typology Code available for payment source | Attending: Family Medicine | Admitting: Family Medicine

## 2021-10-23 ENCOUNTER — Encounter (HOSPITAL_COMMUNITY): Payer: Self-pay

## 2021-10-23 DIAGNOSIS — J020 Streptococcal pharyngitis: Secondary | ICD-10-CM

## 2021-10-23 LAB — POCT RAPID STREP A, ED / UC
Streptococcus, Group A Screen (Direct): NEGATIVE
Streptococcus, Group A Screen (Direct): POSITIVE — AB

## 2021-10-23 LAB — POC INFLUENZA A AND B ANTIGEN (URGENT CARE ONLY)
INFLUENZA A ANTIGEN, POC: NEGATIVE
INFLUENZA B ANTIGEN, POC: NEGATIVE

## 2021-10-23 MED ORDER — AMOXICILLIN 875 MG PO TABS: 875.0000 mg | ORAL_TABLET | Freq: Two times a day (BID) | ORAL | 0 refills | Status: AC

## 2021-10-23 NOTE — ED Provider Notes (Addendum)
[facility]    CSN: [id number] Arrival date & time: 10/23/21  3016      History   Chief Complaint Chief Complaint  Patient presents with   Sore Throat    HPI Kristopher Sharp is a 41 y.o. male.    Sore Throat  Here for h/o fever/chills/aches/malaise since yesterday. His throat is very sore, and it hurts to swallow anything. No n/v/d. No cough/congestion.  Had the flu in November. Was exposed to his fiancee who was ill with similar last week  History reviewed. No pertinent past medical history.  There are no problems to display for this patient.   History reviewed. No pertinent surgical history.     Home Medications    Prior to Admission medications   Medication Sig Start Date End Date Taking? Authorizing Provider  amoxicillin (AMOXIL) 875 MG tablet Take 1 tablet (875 mg total) by mouth 2 (two) times daily for 10 days. 10/23/21 11/02/21 Yes Zenia Resides, MD    Family History Family History  Problem Relation Age of Onset   Cancer Father    Seizures Other     Social History Social History   Tobacco Use   Smoking status: Some Days    Types: Cigars    Last attempt to quit: 10/18/2013    Years since quitting: 8.0   Smokeless tobacco: Never  Vaping Use   Vaping Use: Never used  Substance Use Topics   Alcohol use: Yes    Comment: occasionally   Drug use: Yes    Frequency: 7.0 times per week    Types: Marijuana     Allergies   Patient has no known allergies.   Review of Systems Review of Systems   Physical Exam Triage Vital Signs ED Triage Vitals  Enc Vitals Group     BP 10/23/21 1000 (!) 143/75     Pulse Rate 10/23/21 1000 100     Resp 10/23/21 1000 18     Temp 10/23/21 1000 99.9 F (37.7 C)     Temp Source 10/23/21 1000 Oral     SpO2 10/23/21 1000 99 %     Weight --      Height --      Head Circumference --      Peak Flow --      Pain Score 10/23/21 1001 8     Pain Loc --      Pain Edu? --      Excl. in [place]? --     No data found.  Updated Vital Signs BP (!) 143/75 (BP Location: Left Arm)    Pulse 100    Temp 99.9 F (37.7 C) (Oral)    Resp 18    SpO2 99%   Visual Acuity Right Eye Distance:   Left Eye Distance:   Bilateral Distance:    Right Eye Near:   Left Eye Near:    Bilateral Near:     Physical Exam Vitals reviewed.  Constitutional:      General: He is not in acute distress.    Appearance: He is not toxic-appearing.  HENT:     Right Ear: Tympanic membrane and ear canal normal.     Left Ear: Tympanic membrane and ear canal normal.     Nose: Nose normal.     Mouth/Throat:     Mouth: Mucous membranes are moist.     Comments: Tonsils are 3+, with white exudate and erythema. No asymmetry or other sign of abscess Eyes:  Extraocular Movements: Extraocular movements intact.     Conjunctiva/sclera: Conjunctivae normal.     Pupils: Pupils are equal, round, and reactive to light.  Cardiovascular:     Rate and Rhythm: Normal rate and regular rhythm.     Heart sounds: No murmur heard. Pulmonary:     Effort: Pulmonary effort is normal.     Breath sounds: Normal breath sounds.  Musculoskeletal:     Cervical back: Neck supple.  Lymphadenopathy:     Cervical: No cervical adenopathy.  Skin:    Capillary Refill: Capillary refill takes less than 2 seconds.     Coloration: Skin is not jaundiced or pale.  Neurological:     General: No focal deficit present.     Mental Status: He is alert and oriented to person, place, and time.  Psychiatric:        Behavior: Behavior normal.     UC Treatments / Results  Labs (all labs ordered are listed, but only abnormal results are displayed) Labs Reviewed  POCT RAPID STREP A, ED / UC - Abnormal; Notable for the following components:      Result Value   Streptococcus, Group A Screen (Direct) POSITIVE (*)    All other components within normal limits  POCT RAPID STREP A, ED / UC  POC INFLUENZA A AND B ANTIGEN (URGENT CARE ONLY)     EKG   Radiology No results found.  Procedures Procedures (including critical care time)  Medications Ordered in UC Medications - No data to display  Initial Impression / Assessment and Plan / UC Course  I have reviewed the triage vital signs and the nursing notes.  Pertinent labs & imaging results that were available during my care of the patient were reviewed by me and considered in my medical decision making (see chart for details).     First rapid strep neg; flu test negative. With the appearance of his throat, and pt had felt he had gagged and prevented staff from getting to his OP with the strep swab, his rapid strep was redone.  2nd rapid strep was POSITIVE. Lab result will be amended. Final Clinical Impressions(s) / UC Diagnoses   Final diagnoses:  Strep throat     Discharge Instructions      Your flu test was negative.   Your second strep test was positive.   Take amoxicillin 875 mg 1 tab twice daily for 10 days. Rest, cool fluids. Ibuprofen or tylenol as needed.     ED Prescriptions     Medication Sig Dispense Auth. Provider   amoxicillin (AMOXIL) 875 MG tablet Take 1 tablet (875 mg total) by mouth 2 (two) times daily for 10 days. 20 tablet Saharah Sherrow, Janace Aris, MD      PDMP not reviewed this encounter.   Zenia Resides, MD 10/23/21 1109    Zenia Resides, MD 10/23/21 [phone]

## 2021-10-23 NOTE — Discharge Instructions (Addendum)
Your flu test was negative.   Your second strep test was positive.   Take amoxicillin 875 mg 1 tab twice daily for 10 days. Rest, cool fluids. Ibuprofen or tylenol as needed.

## 2021-10-23 NOTE — ED Triage Notes (Signed)
Pt c/o sore throat, rt ear pain, and fever since yesterday. Hx of strep.

## 2022-05-21 ENCOUNTER — Encounter (HOSPITAL_BASED_OUTPATIENT_CLINIC_OR_DEPARTMENT_OTHER): Payer: Self-pay | Admitting: Family Medicine

## 2022-05-21 ENCOUNTER — Ambulatory Visit (HOSPITAL_BASED_OUTPATIENT_CLINIC_OR_DEPARTMENT_OTHER): Payer: No Typology Code available for payment source | Admitting: Family Medicine

## 2022-10-21 IMAGING — DX DG WRIST COMPLETE 3+V*R*
4 series · 4 of 4 positions shown · non-contrast
Comparison: Right hand series today.

CLINICAL DATA: 39-year-old male status post punched a door.  Pain.

EXAM:
RIGHT WRIST - COMPLETE 3+ VIEW

[wrist pa]
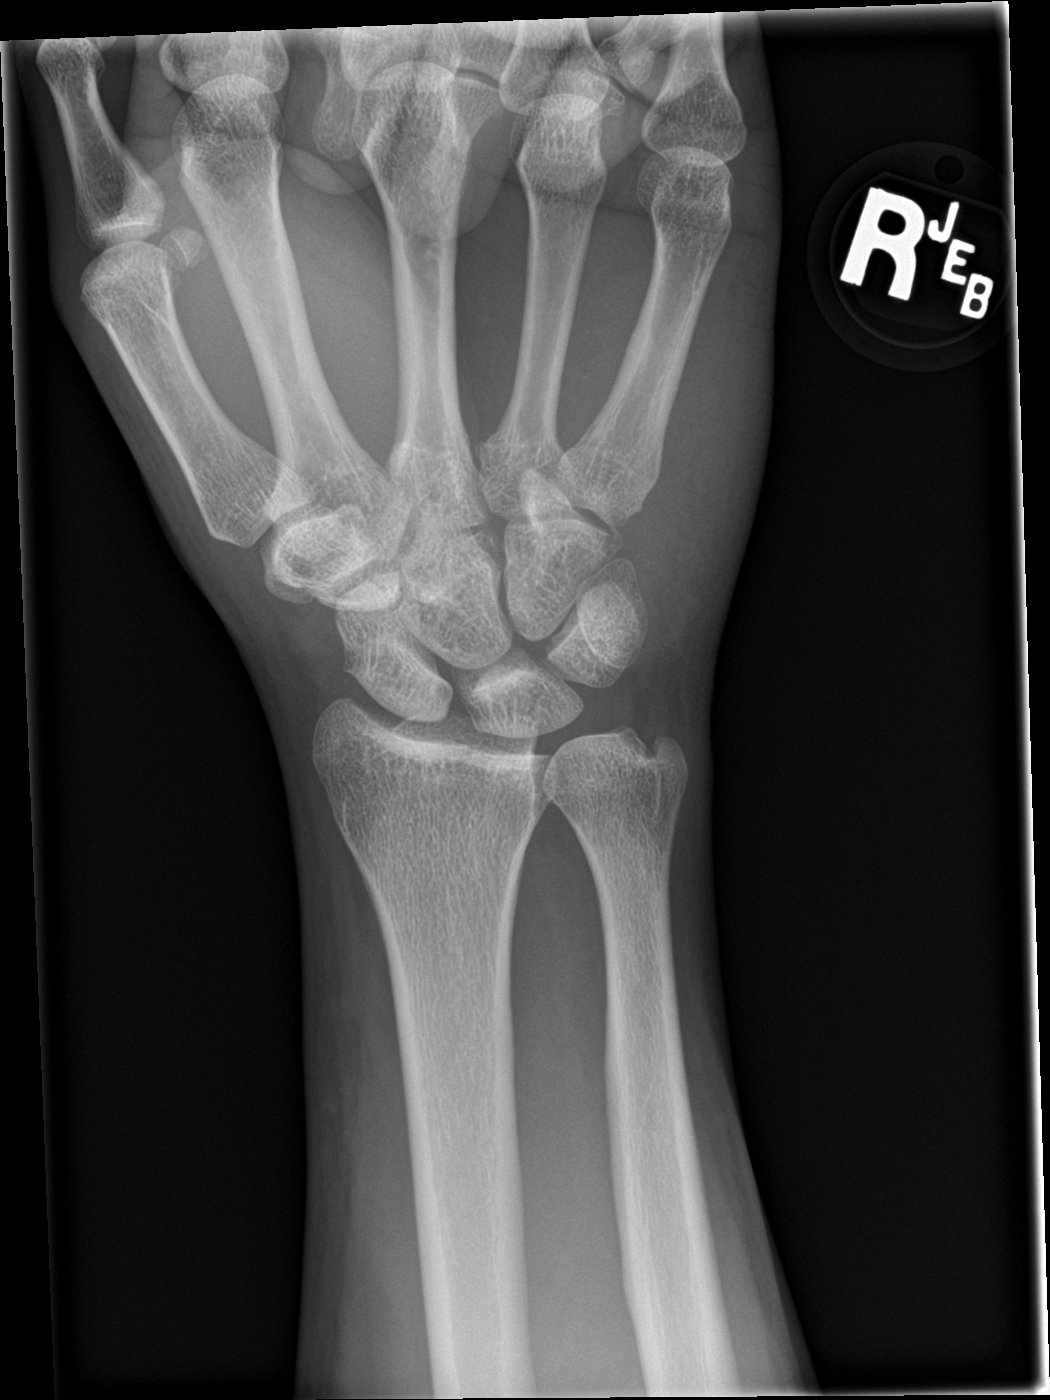

[wrist obl]
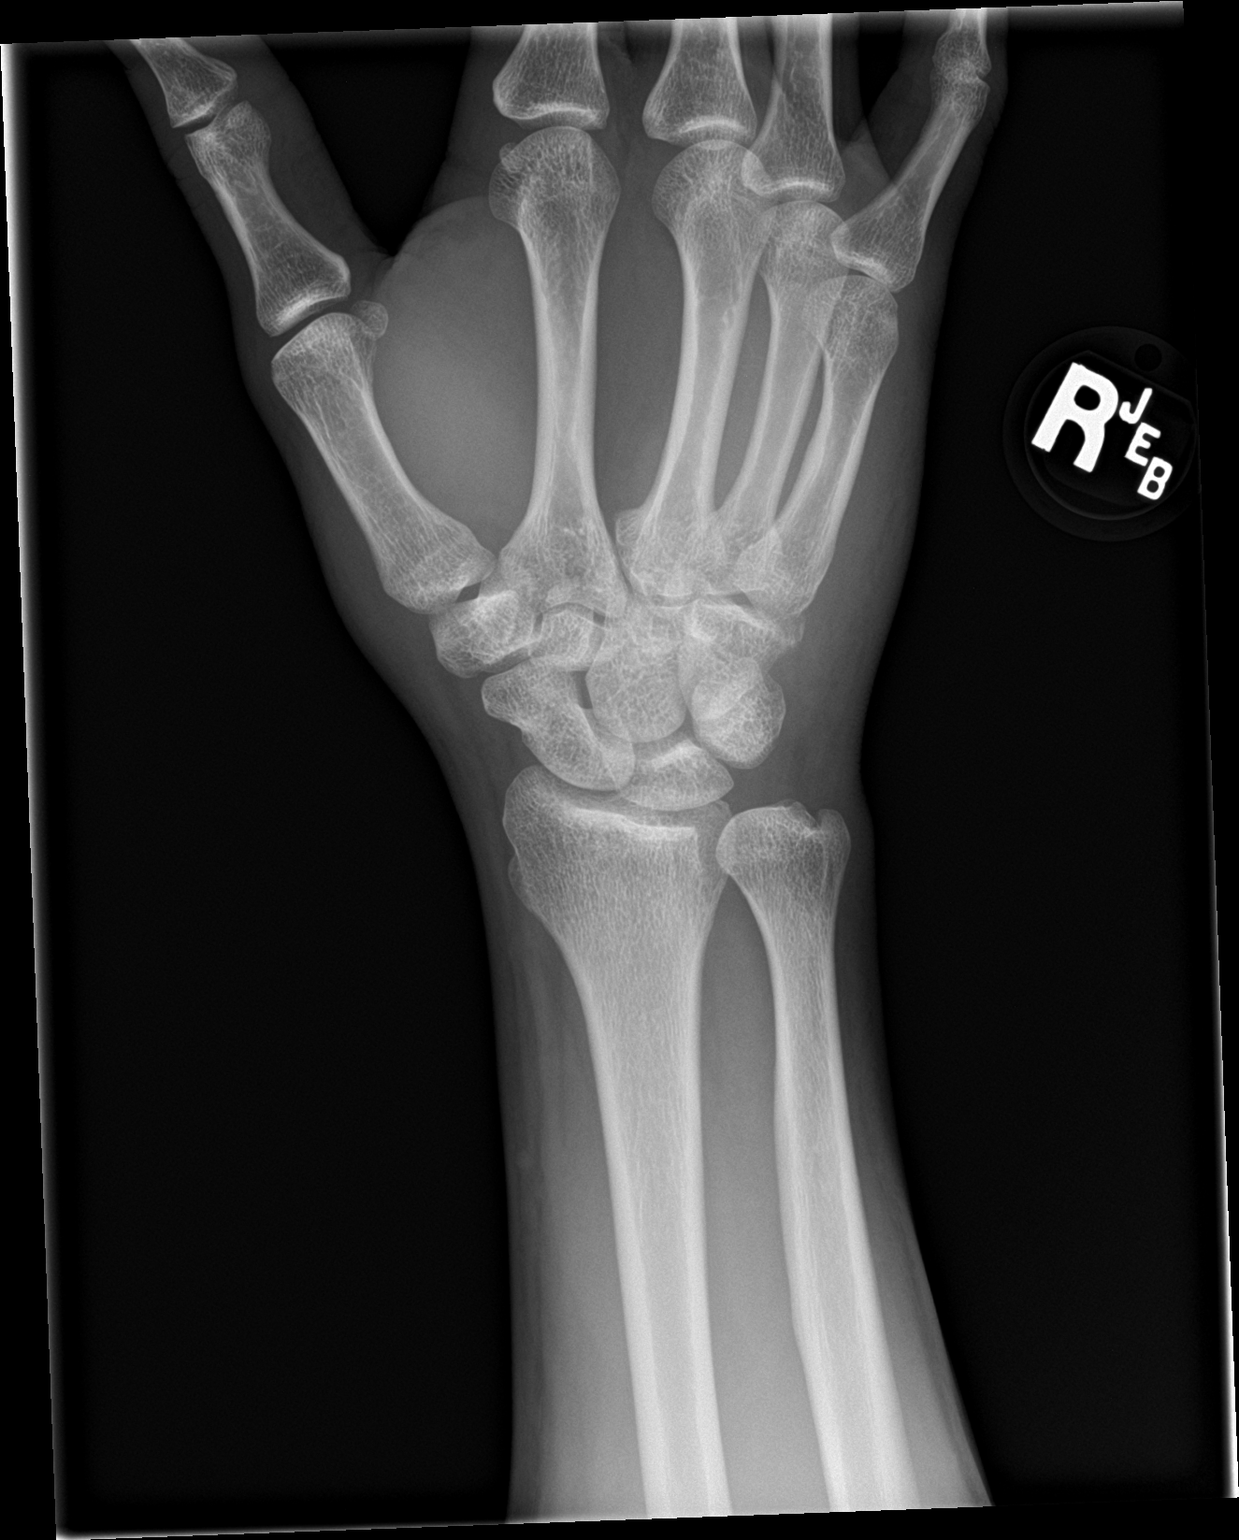

[wrist lat]
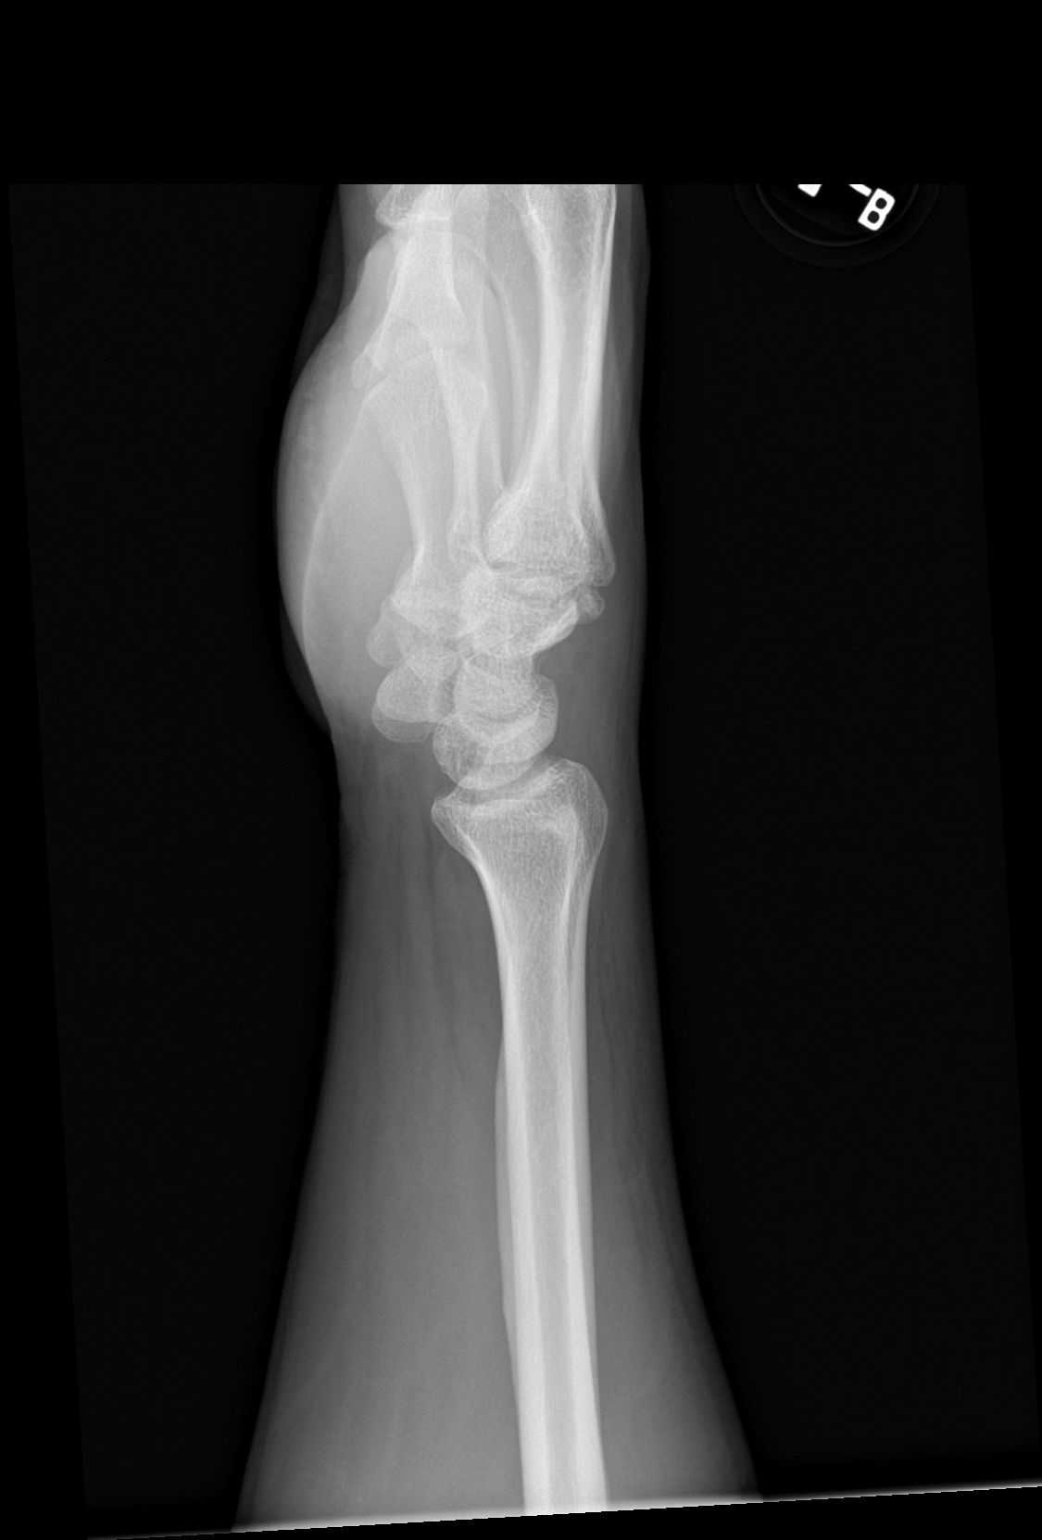

[wrist navicular]
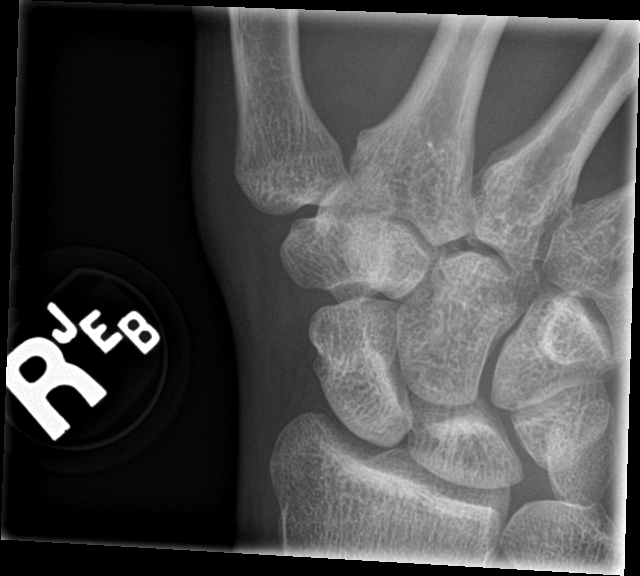

[4 of 4 positions shown; findings below may reference images not displayed]

FINDINGS: Bone mineralization is within normal limits. There is no evidence of
fracture or dislocation. There is no evidence of arthropathy or
other focal bone abnormality. No discrete soft tissue injury.
IMPRESSION: Negative.

## 2023-06-06 ENCOUNTER — Emergency Department (HOSPITAL_COMMUNITY): Payer: 59

## 2023-06-06 ENCOUNTER — Emergency Department (HOSPITAL_COMMUNITY)
Admission: EM | Admit: 2023-06-06 | Discharge: 2023-06-06 | Disposition: A | Discharge status: Home / Self Care | Payer: 59 | Source: Home / Self Care | Attending: Emergency Medicine | Admitting: Emergency Medicine

## 2023-06-06 ENCOUNTER — Encounter (HOSPITAL_COMMUNITY): Payer: Self-pay

## 2023-06-06 ENCOUNTER — Other Ambulatory Visit: Payer: Self-pay

## 2023-06-06 DIAGNOSIS — M7751 Other enthesopathy of right foot: Secondary | ICD-10-CM | POA: Diagnosis not present

## 2023-06-06 DIAGNOSIS — M79671 Pain in right foot: Secondary | ICD-10-CM | POA: Insufficient documentation

## 2023-06-06 MED ORDER — HYDROCODONE-ACETAMINOPHEN 5-325 MG PO TABS
1.0000 | ORAL_TABLET | Freq: Once | ORAL | Status: AC
  Administered 2023-06-06: 1 via ORAL
  Filled 2023-06-06: qty 1

## 2023-06-06 MED ORDER — IBUPROFEN 800 MG PO TABS
800.0000 mg | ORAL_TABLET | Freq: Once | ORAL | Status: AC
  Administered 2023-06-06: 800 mg via ORAL
  Filled 2023-06-06: qty 1

## 2023-06-06 MED ORDER — DICLOFENAC SODIUM 75 MG PO TBEC: 75.0000 mg | DELAYED_RELEASE_TABLET | Freq: Two times a day (BID) | ORAL | 0 refills | Status: DC

## 2023-06-06 NOTE — Discharge Instructions (Addendum)
Return if any problems.  Ice to area of discomfort.  Elevate foot,

## 2023-06-06 NOTE — Progress Notes (Signed)
Orthopedic Tech Progress Note Patient Details:  Kristopher Sharp 10-28-1980 [id number]  Ortho Devices Type of Ortho Device: Crutches, Ace wrap, Postop shoe/boot Ortho Device/Splint Location: Right foot Ortho Device/Splint Interventions: Application   Post Interventions Patient Tolerated: Well  [name] 06/06/2023, 3:12 PM

## 2023-06-06 NOTE — ED Triage Notes (Addendum)
Pt presents to ED from home C/O R foot pain X 1 week. Reports hx plantar fascitis. Denies known injury.

## 2023-06-07 NOTE — ED Provider Notes (Signed)
[facility] Provider Note   CSN: [id number] Arrival date & time: 06/06/23  1220     History  Chief Complaint  Patient presents with   Foot Pain    Kristopher Sharp is a 42 y.o. male.  Patient complains of pain in his right foot.  Patient reports he has past medical history of plantar fasciitis.  Patient reports that the pain in the middle of his foot is similar to what he has had in the past however now he is having pain of the side and back of his ankle.  Patient reports that he is on his feet frequently at work.  Patient denies any injury he denies any fever or chills.  He has not had any numbness or tingling  The history is provided by the patient. No language interpreter was used.  Foot Pain This is a new problem. The current episode started more than 1 week ago. The problem occurs constantly.       Home Medications Prior to Admission medications   Medication Sig Start Date End Date Taking? Authorizing Provider  diclofenac (VOLTAREN) 75 MG EC tablet Take 1 tablet (75 mg total) by mouth 2 (two) times daily. 06/06/23  Yes Elson Areas, PA-C  diclofenac (VOLTAREN) 75 MG EC tablet Take 1 tablet (75 mg total) by mouth 2 (two) times daily. 06/06/23  Yes Elson Areas, PA-C      Allergies    Patient has no known allergies.    Review of Systems   Review of Systems  All other systems reviewed and are negative.   Physical Exam Updated Vital Signs BP 104/81 (BP Location: Right Arm)   Pulse 72   Temp 97.9 F (36.6 C) (Oral)   Resp 16   Ht 5\' 6"  (1.676 m)   Wt 77.1 kg   SpO2 97%   BMI 27.44 kg/m  Physical Exam Vitals reviewed.  Constitutional:      Appearance: Normal appearance.  Musculoskeletal:        General: Tenderness present.     Comments: Tender right foot and heel.  Tender lateral malleolus and Achilles area, full range of motion neurovascular neurosensory intact  Skin:    General: Skin is warm.   Neurological:     General: No focal deficit present.     Mental Status: He is alert.  Psychiatric:        Mood and Affect: Mood normal.     ED Results / Procedures / Treatments   Labs (all labs ordered are listed, but only abnormal results are displayed) Labs Reviewed - No data to display  EKG None  Radiology DG Foot Complete Right  Result Date: 06/06/2023 CLINICAL DATA:  Right foot and ankle pain, no known injury. EXAM: RIGHT ANKLE - COMPLETE 3+ VIEW; RIGHT FOOT COMPLETE - 3+ VIEW COMPARISON:  Right ankle radiograph 01/27/2014 FINDINGS: Ankle: There is no evidence of fracture, dislocation, or joint effusion. Small Achilles tendon enthesophyte. There is no evidence of arthropathy or other focal bone abnormality. Soft tissues are unremarkable. Foot: No evidence of fracture or dislocation. The alignment is normal. There is joint space narrowing and spurring at the medial cuneiform navicular articulation. Mild talonavicular spurring. No erosive change or periostitis. No focal soft tissue abnormalities. IMPRESSION: 1. No acute osseous abnormality of the right ankle or foot. 2. Small Achilles tendon enthesophyte. 3. 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 Electronically Signed   By: Narda Rutherford M.D.   On:  06/06/2023 13:51   DG Ankle Complete Right  Result Date: 06/06/2023 CLINICAL DATA:  Right foot and ankle pain, no known injury. EXAM: RIGHT ANKLE - COMPLETE 3+ VIEW; RIGHT FOOT COMPLETE - 3+ VIEW COMPARISON:  Right ankle radiograph 01/27/2014 FINDINGS: Ankle: There is no evidence of fracture, dislocation, or joint effusion. Small Achilles tendon enthesophyte. There is no evidence of arthropathy or other focal bone abnormality. Soft tissues are unremarkable. Foot: No evidence of fracture or dislocation. The alignment is normal. There is joint space narrowing and spurring at the medial cuneiform navicular articulation. Mild talonavicular spurring. No erosive change or periostitis. No focal soft  tissue abnormalities. IMPRESSION: 1. No acute osseous abnormality of the right ankle or foot. 2. Small Achilles tendon enthesophyte. 3. 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 Electronically Signed   By: Narda Rutherford M.D.   On: 06/06/2023 13:51    Procedures Procedures    Medications Ordered in ED Medications  HYDROcodone-acetaminophen (NORCO/VICODIN) 5-325 MG per tablet 1 tablet (1 tablet Oral Given 06/06/23 1356)  ibuprofen (ADVIL) tablet 800 mg (800 mg Oral Given 06/06/23 1357)    ED Course/ Medical Decision Making/ A&P                                 Medical Decision Making Patient complains of pain in his foot and his heel for over a week.  Amount and/or Complexity of Data Reviewed Independent Historian: spouse    Details: Patient is here with his spouse who is supportive Radiology: ordered and independent interpretation performed. Decision-making details documented in ED Course.    Details: X-rays ordered reviewed and interpreted.  Patient has a calcification of the Achilles tendon.  Patient has some degenerative changes of his midfoot  Risk Prescription drug management. Risk Details: Patient placed in a Ace wrap and postop shoe he is given a prescription for Voltaren.  Patient is advised to schedule follow-up with podiatry for recheck.  As patient works standing he is given a note for out of work.  He is advised to return if any problems.           Final Clinical Impression(s) / ED Diagnoses Final diagnoses:  Foot pain, right  Tendonitis of ankle, right    Rx / DC Orders ED Discharge Orders          Ordered    diclofenac (VOLTAREN) 75 MG EC tablet  2 times daily        06/06/23 1419    diclofenac (VOLTAREN) 75 MG EC tablet  2 times daily        06/06/23 1441           An After Visit Summary was printed and given to the patient.    Osie Cheeks 06/07/23 7829    Jacalyn Lefevre, MD 06/07/23 [phone]

## 2024-03-19 ENCOUNTER — Other Ambulatory Visit: Payer: Self-pay

## 2024-03-19 ENCOUNTER — Encounter (HOSPITAL_COMMUNITY): Payer: Self-pay | Admitting: Emergency Medicine

## 2024-03-19 ENCOUNTER — Emergency Department (HOSPITAL_COMMUNITY)

## 2024-03-19 ENCOUNTER — Emergency Department (HOSPITAL_COMMUNITY)
Admission: EM | Admit: 2024-03-19 | Discharge: 2024-03-19 | Disposition: A | Discharge status: Home / Self Care | Attending: Emergency Medicine | Admitting: Emergency Medicine

## 2024-03-19 DIAGNOSIS — S39012A Strain of muscle, fascia and tendon of lower back, initial encounter: Secondary | ICD-10-CM

## 2024-03-19 DIAGNOSIS — Y92410 Unspecified street and highway as the place of occurrence of the external cause: Secondary | ICD-10-CM | POA: Insufficient documentation

## 2024-03-19 DIAGNOSIS — M545 Low back pain, unspecified: Secondary | ICD-10-CM | POA: Insufficient documentation

## 2024-03-19 MED ORDER — NAPROXEN 500 MG PO TABS: 500.0000 mg | ORAL_TABLET | Freq: Two times a day (BID) | ORAL | 0 refills | Status: AC

## 2024-03-19 MED ORDER — METHOCARBAMOL 750 MG PO TABS: 750.0000 mg | ORAL_TABLET | Freq: Three times a day (TID) | ORAL | 0 refills | Status: AC

## 2024-03-19 NOTE — Discharge Instructions (Addendum)
 Please follow-up closely with a primary care doctor on an outpatient basis.  Return to emergency department immediately for any new or worsening symptoms.
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
